# Patient Record
Sex: Female | Born: 1945 | Race: White | Marital: Married | Smoking: Never smoker
Health system: Southern US, Community
[De-identification: ages and names within clinical notes are randomized; demographics above are authoritative.]

## PROBLEM LIST (undated history)

## (undated) DIAGNOSIS — E039 Hypothyroidism, unspecified: Secondary | ICD-10-CM

## (undated) DIAGNOSIS — K219 Gastro-esophageal reflux disease without esophagitis: Secondary | ICD-10-CM

## (undated) HISTORY — PX: TUBAL LIGATION: SHX77

---

## 2017-04-08 ENCOUNTER — Encounter: Payer: Self-pay | Admitting: Emergency Medicine

## 2017-04-08 ENCOUNTER — Emergency Department: Payer: Medicare Other

## 2017-04-08 ENCOUNTER — Emergency Department
Admission: EM | Admit: 2017-04-08 | Discharge: 2017-04-08 | Disposition: A | Discharge status: Home / Self Care | Payer: Medicare Other | Attending: Emergency Medicine | Admitting: Emergency Medicine

## 2017-04-08 DIAGNOSIS — R0789 Other chest pain: Secondary | ICD-10-CM | POA: Insufficient documentation

## 2017-04-08 DIAGNOSIS — R079 Chest pain, unspecified: Secondary | ICD-10-CM

## 2017-04-08 LAB — CBC
HCT: 43.9 % (ref 35.0–47.0)
Hemoglobin: 14.6 g/dL (ref 12.0–16.0)
MCH: 29.9 pg (ref 26.0–34.0)
MCHC: 33.2 g/dL (ref 32.0–36.0)
MCV: 90.1 fL (ref 80.0–100.0)
PLATELETS: 250 10*3/uL (ref 150–440)
RBC: 4.88 MIL/uL (ref 3.80–5.20)
RDW: 14.2 % (ref 11.5–14.5)
WBC: 4.8 10*3/uL (ref 3.6–11.0)

## 2017-04-08 LAB — BASIC METABOLIC PANEL
Anion gap: 12 (ref 5–15)
BUN: 9 mg/dL (ref 6–20)
CALCIUM: 9.3 mg/dL (ref 8.9–10.3)
CO2: 22 mmol/L (ref 22–32)
Chloride: 105 mmol/L (ref 101–111)
Creatinine, Ser: 0.63 mg/dL (ref 0.44–1.00)
GFR calc non Af Amer: >60 mL/min (ref >60)
Glucose, Bld: 91 mg/dL (ref 65–99)
Potassium: 3.6 mmol/L (ref 3.5–5.1)
SODIUM: 139 mmol/L (ref 135–145)

## 2017-04-08 LAB — TROPONIN I

## 2017-04-08 MED ORDER — PANTOPRAZOLE SODIUM 40 MG PO TBEC: 40.0000 mg | DELAYED_RELEASE_TABLET | Freq: Every day | ORAL | 1 refills | Status: DC

## 2017-04-08 MED ORDER — IOPAMIDOL (ISOVUE-370) INJECTION 76%
75.0000 mL | Freq: Once | INTRAVENOUS | Status: AC | PRN
  Administered 2017-04-08: 75 mL via INTRAVENOUS

## 2017-04-08 MED ORDER — GI COCKTAIL ~~LOC~~
30.0000 mL | Freq: Once | ORAL | Status: AC
  Administered 2017-04-08: 30 mL via ORAL
  Filled 2017-04-08: qty 30

## 2017-04-08 NOTE — ED Provider Notes (Signed)
[facility] Emergency Department Provider Note  Time seen: 8:55 AM  I have reviewed the triage vital signs and the nursing notes.   HISTORY  Chief Complaint Chest Pain and Palpitations    HPI Marilyn Castillo is a 72 y.o. female with no past medical history, presents the emergency department for intermittent chest discomfort. According to the patient for the past 3 weeks she has had various pains in her chest including her central chest, back, sometimes in her neck. Chest is states occasionally she will have numbness or tingling sensations in her legs and arms. Patient denies any trouble breathing, nausea or diaphoresis. States the pain has been intermittent over the past 3 weeks and occurs in different parts of her chest. During our examination the patient states the pain was jumping around from her left chest to her back. Denies any heartburn history. Patient states she has not seen a doctor since the 1990s. Denies any leg pain or swelling. Denies any pleuritic pain. Describes the discomfort as mild aching type pain that jumps around from place to place within her chest.  History reviewed. No pertinent past medical history.  There are no active problems to display for this patient.   Past Surgical History:  Procedure Laterality Date  . TUBAL LIGATION      Prior to Admission medications   Not on File    Allergies  Allergen Reactions  . Penicillins Anaphylaxis    History reviewed. No pertinent family history.  Social History Social History  Substance Use Topics  . Smoking status: Never Smoker  . Smokeless tobacco: Never Used  . Alcohol use Yes    Review of Systems Constitutional: Negative for fever. Eyes: Negative for visual changes. ENT: Negative for congestion Cardiovascular: Positive for chest pain, worse since last night although somewhat improved this morning. Respiratory: Negative for shortness of breath. Negative for  cough Gastrointestinal: Negative for abdominal pain, vomiting and diarrhea. Genitourinary: Negative for dysuria. Musculoskeletal: Negative for back pain. Skin: Negative for rash. Neurological: Negative for headache All other ROS negative  ____________________________________________   PHYSICAL EXAM:  VITAL SIGNS: ED Triage Vitals  Enc Vitals Group     BP 04/08/17 0731 (!) 155/76     Pulse Rate 04/08/17 0731 80     Resp 04/08/17 0731 20     Temp 04/08/17 0731 97.8 F (36.6 C)     Temp Source 04/08/17 0731 Oral     SpO2 04/08/17 0731 95 %     Weight 04/08/17 0728 200 lb (90.7 kg)     Height 04/08/17 0728 5\' 6"  (1.676 m)     Head Circumference --      Peak Flow --      Pain Score 04/08/17 0727 8     Pain Loc --      Pain Edu? --      Excl. in [place]? --     Constitutional: Alert and oriented. Well appearing and in no distress. Eyes: Normal exam ENT   Head: Normocephalic and atraumatic.   Mouth/Throat: Mucous membranes are moist. Cardiovascular: Normal rate, regular rhythm. No murmur Respiratory: Normal respiratory effort without tachypnea nor retractions. Breath sounds are clear Gastrointestinal: Soft and nontender. No distention.  Musculoskeletal: Nontender with normal range of motion in all extremities. No lower extremity tenderness or edema. Neurologic:  Normal speech and language. No gross focal neurologic deficits Skin:  Skin is warm, dry and intact.  Psychiatric: Mood and affect are normal.  ____________________________________________    EKG  EKG reviewed and interpreted by myself shows normal sinus rhythm at 84 bpm, narrow QRS, normal axis, normal intervals, no concerning ST changes.  ____________________________________________    RADIOLOGY  Chest x-ray is negative  ____________________________________________   INITIAL IMPRESSION / ASSESSMENT AND PLAN / ED COURSE  Pertinent labs & imaging results that were available during my care of the  patient were reviewed by me and considered in my medical decision making (see chart for details).  Patient presents to the emergency department for intermittent chest discomfort over the past 3 weeks although somewhat worse since last night. Patient states the pain jumps around in her chest. States mild dull aching type pain but cannot pinpoint where it is that she states it moves around her chest. Denies any cough or pleuritic pain. No leg pain or tenderness. No lower extremity edema. Overall the patient appears well with a reassuring EKG, negative labs including negative troponin, normal chest x-ray. We will treat with a GI cocktail, given the patient's vague chest discomfort worse since last night we'll obtain a CT scan.  CT scan is negative. Patient continues to appear well. I discussed with the patient follow up with cardiologist for a stress test as well as follow-up with a GI doctor. I also discussed my typical return precautions. Patient agreeable.  ____________________________________________   FINAL CLINICAL IMPRESSION(S) / ED DIAGNOSES  Chest pain    Harvest Dark, MD 04/08/17 1115

## 2017-04-08 NOTE — Discharge Instructions (Signed)
You have been seen in the emergency department today for chest pain. Your workup has shown normal results. As we discussed please follow-up with your primary care physician in the next 1-2 days for recheck. Return to the emergency department for any further chest pain, trouble breathing, or any other symptom personally concerning to yourself.  Please take your medication as prescribed daily. Please call the number provided to arrange follow-up with a primary care doctor, cardiologist, as well as a GI doctor.

## 2017-04-08 NOTE — ED Triage Notes (Signed)
Pt states she started having chest pain about 3 weeks ago, states last night, the pain was intense and had some shortness of breath. States 3 weeks ago she had an episode of blurred vision and dizziness and since then she states she has had chest pain off and on.

## 2017-05-05 ENCOUNTER — Emergency Department
Admission: EM | Admit: 2017-05-05 | Discharge: 2017-05-05 | Disposition: A | Discharge status: Home / Self Care | Payer: Medicare Other | Attending: Emergency Medicine | Admitting: Emergency Medicine

## 2017-05-05 ENCOUNTER — Encounter: Payer: Self-pay | Admitting: Emergency Medicine

## 2017-05-05 ENCOUNTER — Emergency Department: Payer: Medicare Other

## 2017-05-05 DIAGNOSIS — R51 Headache: Secondary | ICD-10-CM | POA: Insufficient documentation

## 2017-05-05 DIAGNOSIS — R519 Headache, unspecified: Secondary | ICD-10-CM

## 2017-05-05 HISTORY — DX: Gastro-esophageal reflux disease without esophagitis: K21.9

## 2017-05-05 LAB — CBC WITH DIFFERENTIAL/PLATELET
BASOS ABS: 0 10*3/uL (ref 0–0.1)
Basophils Relative: 1 %
EOS PCT: 2 %
Eosinophils Absolute: 0.1 10*3/uL (ref 0–0.7)
HCT: 42 % (ref 35.0–47.0)
HEMOGLOBIN: 14.3 g/dL (ref 12.0–16.0)
LYMPHS PCT: 19 %
Lymphs Abs: 1 10*3/uL (ref 1.0–3.6)
MCH: 31 pg (ref 26.0–34.0)
MCHC: 34 g/dL (ref 32.0–36.0)
MCV: 91 fL (ref 80.0–100.0)
Monocytes Absolute: 0.7 10*3/uL (ref 0.2–0.9)
Monocytes Relative: 13 %
NEUTROS ABS: 3.5 10*3/uL (ref 1.4–6.5)
NEUTROS PCT: 65 %
PLATELETS: 227 10*3/uL (ref 150–440)
RBC: 4.62 MIL/uL (ref 3.80–5.20)
RDW: 14.4 % (ref 11.5–14.5)
WBC: 5.4 10*3/uL (ref 3.6–11.0)

## 2017-05-05 LAB — BASIC METABOLIC PANEL
ANION GAP: 9 (ref 5–15)
BUN: 9 mg/dL (ref 6–20)
CO2: 26 mmol/L (ref 22–32)
Calcium: 9.6 mg/dL (ref 8.9–10.3)
Chloride: 105 mmol/L (ref 101–111)
Creatinine, Ser: 0.71 mg/dL (ref 0.44–1.00)
GFR calc Af Amer: >60 mL/min (ref >60)
GLUCOSE: 101 mg/dL — AB (ref 65–99)
POTASSIUM: 3.6 mmol/L (ref 3.5–5.1)
Sodium: 140 mmol/L (ref 135–145)

## 2017-05-05 MED ORDER — DEXTROSE-NACL 5-0.45 % IV SOLN
INTRAVENOUS | Status: DC
  Administered 2017-05-05: 06:00:00 via INTRAVENOUS

## 2017-05-05 MED ORDER — HYDROCODONE-ACETAMINOPHEN 5-325 MG PO TABS
0.5000 | ORAL_TABLET | Freq: Once | ORAL | Status: AC
  Administered 2017-05-05: 0.5 via ORAL
  Filled 2017-05-05: qty 1

## 2017-05-05 MED ORDER — LORAZEPAM 2 MG/ML IJ SOLN
0.5000 mg | Freq: Once | INTRAMUSCULAR | Status: AC
  Administered 2017-05-05: 0.5 mg via INTRAVENOUS
  Filled 2017-05-05: qty 1

## 2017-05-05 MED ORDER — ONDANSETRON HCL 4 MG/2ML IJ SOLN
4.0000 mg | Freq: Once | INTRAMUSCULAR | Status: AC
  Administered 2017-05-05: 4 mg via INTRAVENOUS
  Filled 2017-05-05: qty 2

## 2017-05-05 MED ORDER — KETOROLAC TROMETHAMINE 30 MG/ML IJ SOLN
10.0000 mg | Freq: Once | INTRAMUSCULAR | Status: AC
Start: 2017-05-05 — End: 2017-05-05
  Administered 2017-05-05: 9.9 mg via INTRAVENOUS
  Filled 2017-05-05: qty 1

## 2017-05-05 MED ORDER — GADOBENATE DIMEGLUMINE 529 MG/ML IV SOLN
17.0000 mL | Freq: Once | INTRAVENOUS | Status: AC | PRN
  Administered 2017-05-05: 17 mL via INTRAVENOUS

## 2017-05-05 NOTE — ED Notes (Signed)
Pt to CT at this time.

## 2017-05-05 NOTE — ED Provider Notes (Signed)
-----------------------------------------   10:34 AM on 05/05/2017 -----------------------------------------  Administration was signed out to me this morning at 7:30, she has very reproducible tenderness to the back of her head but no evidence of infection or inflammation. She is very anxious about this. A CT was negative, MRI was ordered by prior physician. According to sign out, and that is negative patient is safe for discharge. Patient feels much better. She still has a little tenderness to the back of her head but there is no evidence of any evidence of trauma or inflammation or infection, patient would prefer to go home at this time. Return precautions and follow-up given and understood. Exact etiology for this patient's very reproducible scalp tenderness not elucidated either by exam or by extensive imaging. She is neurologically intact at discharge and very eager to leave. She will follow up and return precautions are given and understood   Schuyler Amor, MD 05/05/17 1035

## 2017-05-05 NOTE — ED Notes (Signed)
Pt states pain began yesterday, from base of skull towards face, radiating down entire face. Pt denies OTC tx PTA. Pt states tingling in hands and feet and dry mouth. Pt husband reports pt has had lack of appetite x approx 1 month since seen here for palpitations

## 2017-05-05 NOTE — ED Provider Notes (Signed)
[facility] Emergency Department Provider Note   ____________________________________________   First MD Initiated Contact with Patient 05/05/17 [phone]     (approximate)  I have reviewed the triage vital signs and the nursing notes.   HISTORY  Chief Complaint Headache    HPI Marilyn Castillo is a 71 y.o. female who presents to the ED from home with a chief complaint of headache. Patient describes gradual onset headache starting in her posterior head radiating into her forehead, eyes and face. Symptoms started last evening. Associated with blurry vision and nausea. No history of migraines but had a headache last week as well. Denies recent fever, chills, neck pain, chest pain, shortness of breath, abdominal pain, vomiting, diarrhea. Denies recent travel or trauma. Do not take any OTCs prior to arrival.Spouse denies slurred speech, confusion. Patient denies focal weakness, numbness or tingling.   Past Medical History:  Diagnosis Date  . Acid reflux     There are no active problems to display for this patient.   Past Surgical History:  Procedure Laterality Date  . TUBAL LIGATION      Prior to Admission medications   Medication Sig Start Date End Date Taking? Authorizing Provider  levothyroxine (SYNTHROID, LEVOTHROID) 50 MCG tablet Take 50 mcg by mouth daily. 05/04/17 05/04/18 Yes [provider]  pantoprazole (PROTONIX) 40 MG tablet Take 1 tablet (40 mg total) by mouth daily. 04/08/17 04/08/18  Harvest Dark, MD    Allergies Penicillins  Family history None for migraines None for cerebral aneurysm  Social History Social History  Substance Use Topics  . Smoking status: Never Smoker  . Smokeless tobacco: Never Used  . Alcohol use Yes    Review of Systems  Constitutional: Positive for decreased appetite over the past month without significant weight loss. No fever/chills. Eyes: No visual changes. ENT: No sore  throat. Cardiovascular: Denies chest pain. Respiratory: Denies shortness of breath. Gastrointestinal: No abdominal pain.  No nausea, no vomiting.  No diarrhea.  No constipation. Genitourinary: Negative for dysuria. Musculoskeletal: Negative for back pain. Skin: Negative for rash. Neurological: Positive for headache. Negative for focal weakness or numbness.   ____________________________________________   PHYSICAL EXAM:  VITAL SIGNS: ED Triage Vitals [05/05/17 0448]  Enc Vitals Group     BP      Pulse      Resp      Temp      Temp src      SpO2      Weight 186 lb (84.4 kg)     Height _0  (1.651 m)     Head Circumference      Peak Flow      Pain Score 8     Pain Loc      Pain Edu?      Excl. in [place]?     Constitutional: Alert and oriented. Well appearing and in no acute distress. Eyes: Conjunctivae are normal. PERRL. EOMI. Funduscopy within normal limits. Head: Atraumatic. Nose: No congestion/rhinnorhea. Mouth/Throat: Mucous membranes are moist.  Oropharynx non-erythematous. Neck: No stridor.  No carotid bruits. Supple neck without meningismus. Cardiovascular: Normal rate, regular rhythm. Grossly normal heart sounds.  Good peripheral circulation. Respiratory: Normal respiratory effort.  No retractions. Lungs CTAB. Gastrointestinal: Soft and nontender. No distention. No abdominal bruits. No CVA tenderness. Musculoskeletal: No lower extremity tenderness nor edema.  No joint effusions. Neurologic:  Normal speech and language. Alert and oriented 3. CN II-XII personally intact. No gross focal neurologic deficits are appreciated. No gait instability.  Skin:  Skin is warm, dry and intact. No rash noted. No petechiae. Psychiatric: Mood and affect are flat. Speech and behavior are normal.  ____________________________________________   LABS (all labs ordered are listed, but only abnormal results are displayed)  Labs Reviewed  BASIC METABOLIC PANEL - Abnormal; Notable for  the following:       Result Value   Glucose, Bld 101 (*)    All other components within normal limits  CBC WITH DIFFERENTIAL/PLATELET   ____________________________________________  EKG  None ____________________________________________  RADIOLOGY  CT head interpreted per Dr. Radene Knee: 1. No acute intracranial pathology seen on CT.  2. Mild cortical volume loss and scattered small vessel ischemic  microangiopathy.   ____________________________________________   PROCEDURES  Procedure(s) performed: None  Procedures  Critical Care performed: No  ____________________________________________   INITIAL IMPRESSION / ASSESSMENT AND PLAN / ED COURSE  Pertinent labs & imaging results that were available during my care of the patient were reviewed by me and considered in my medical decision making (see chart for details).  71 year old female, generally healthy, who presents with gradual onset headache associated with blurry vision and nausea. She is neurologically intact without focal deficits. Given her age and new onset of headache, will obtain CT scan. Will obtain screening lab work, administer analgesic, antiemetic and reassess.  Clinical Course as of May 06 755  Fri May 05, 2017  0725 Updated patient and spouse of laboratory and CT imaging results. States she is not feeling any better after Toradol. After lengthy discussion, it seems that patient is complaining of topical pain of her posterior scalp. She describes her pain is worsened if she puts her head on the pillow. Scalp was examined which did not reveal vesicles or signs of active infection. Posterior scalp was tender on my palpation. Patient refused topical analgesics and initially refused IV or oral analgesia because she does not like to take medicines. However, patient still complains of pain and is clearly dissatisfied and frustrated. States she has had a myriad of symptoms for the past month including decreased  appetite, palpitations, heartburn and now headache. She went to her husband's PCP because she does not have one of her own. PCP did more lab work and told her her thyroid level was slightly low. Patient is frustrated and feels like her symptoms have not been addressed by her PCP and feels she needs a diagnosis today. Given her continued head pain and concern for something more sinister causing her head pain, will proceed with MRI brain this morning. Care transferred to Dr. Burlene Arnt pending MRI results/disposition.  [JS]    Clinical Course User Index [JS] Paulette Blanch, MD     ____________________________________________   FINAL CLINICAL IMPRESSION(S) / ED DIAGNOSES  Final diagnoses:  Acute nonintractable headache, unspecified headache type      NEW MEDICATIONS STARTED DURING THIS VISIT:  New Prescriptions   No medications on file     Note:  This document was prepared using Dragon voice recognition software and may include unintentional dictation errors.    Paulette Blanch, MD 05/05/17 [phone]

## 2017-05-05 NOTE — Discharge Instructions (Signed)
Return to the emergency room for any new or worrisome symptoms including but not limited to worsening or changing headache, numbness, weakness, fever, redness or swelling. Follow closely with primary care and, if PCP feels  you need it, follow closely with neurologist. Take Tylenol for the pain if it persists.

## 2017-05-05 NOTE — ED Triage Notes (Signed)
Pt c/o headache since last night accompanied by blurry vision. Pt denies any other accompanying symptoms.

## 2017-06-21 ENCOUNTER — Emergency Department
Admission: EM | Admit: 2017-06-21 | Discharge: 2017-06-21 | Disposition: A | Discharge status: Home / Self Care | Payer: Medicare Other | Attending: Emergency Medicine | Admitting: Emergency Medicine

## 2017-06-21 ENCOUNTER — Emergency Department: Payer: Medicare Other

## 2017-06-21 DIAGNOSIS — Z79899 Other long term (current) drug therapy: Secondary | ICD-10-CM | POA: Diagnosis not present

## 2017-06-21 DIAGNOSIS — R0602 Shortness of breath: Secondary | ICD-10-CM | POA: Diagnosis present

## 2017-06-21 LAB — TROPONIN I: Troponin I: <0.03 ng/mL (ref <0.03)

## 2017-06-21 LAB — BASIC METABOLIC PANEL
Anion gap: 12 (ref 5–15)
BUN: 12 mg/dL (ref 6–20)
CHLORIDE: 107 mmol/L (ref 101–111)
CO2: 22 mmol/L (ref 22–32)
Calcium: 9.8 mg/dL (ref 8.9–10.3)
Creatinine, Ser: 0.73 mg/dL (ref 0.44–1.00)
GFR calc Af Amer: >60 mL/min (ref >60)
GFR calc non Af Amer: >60 mL/min (ref >60)
Glucose, Bld: 101 mg/dL — ABNORMAL HIGH (ref 65–99)
POTASSIUM: 3.9 mmol/L (ref 3.5–5.1)
SODIUM: 141 mmol/L (ref 135–145)

## 2017-06-21 LAB — CBC
HEMATOCRIT: 46.8 % (ref 35.0–47.0)
HEMOGLOBIN: 15.8 g/dL (ref 12.0–16.0)
MCH: 30.3 pg (ref 26.0–34.0)
MCHC: 33.7 g/dL (ref 32.0–36.0)
MCV: 90 fL (ref 80.0–100.0)
Platelets: 267 10*3/uL (ref 150–440)
RBC: 5.2 MIL/uL (ref 3.80–5.20)
RDW: 13.6 % (ref 11.5–14.5)
WBC: 7.6 10*3/uL (ref 3.6–11.0)

## 2017-06-21 NOTE — ED Notes (Signed)
ED Provider at bedside. 

## 2017-06-21 NOTE — Discharge Instructions (Signed)
Fortunately today you're bloodwork EKG and chest x-ray were all reassuring. Her vital signs look good and your exam is very normal. I'm not sure why you felt short of breath, however I'm reassured by everything we did today. Please make an appointment to follow-up with your primary care physician on Monday for reexamination. Return to the emergency department sooner for any concerns.  It was a pleasure to take care of you today, and thank you for coming to our emergency department.  If you have any questions or concerns before leaving please ask the nurse to grab me and I'm more than happy to go through your aftercare instructions again.  If you were prescribed any opioid pain medication today such as Norco, Vicodin, Percocet, morphine, hydrocodone, or oxycodone please make sure you do not drive when you are taking this medication as it can alter your ability to drive safely.  If you have any concerns once you are home that you are not improving or are in fact getting worse before you can make it to your follow-up appointment, please do not hesitate to call 911 and come back for further evaluation.  Darel Hong MD  Results for orders placed or performed during the hospital encounter of 63/01/60  Basic metabolic panel  Result Value Ref Range   Sodium 141 135 - 145 mmol/L   Potassium 3.9 3.5 - 5.1 mmol/L   Chloride 107 101 - 111 mmol/L   CO2 22 22 - 32 mmol/L   Glucose, Bld 101 (H) 65 - 99 mg/dL   BUN 12 6 - 20 mg/dL   Creatinine, Ser 0.73 0.44 - 1.00 mg/dL   Calcium 9.8 8.9 - 10.3 mg/dL   GFR calc non Af Amer >60 >60 mL/min   GFR calc Af Amer >60 >60 mL/min   Anion gap 12 5 - 15  CBC  Result Value Ref Range   WBC 7.6 3.6 - 11.0 K/uL   RBC 5.20 3.80 - 5.20 MIL/uL   Hemoglobin 15.8 12.0 - 16.0 g/dL   HCT 46.8 35.0 - 47.0 %   MCV 90.0 80.0 - 100.0 fL   MCH 30.3 26.0 - 34.0 pg   MCHC 33.7 32.0 - 36.0 g/dL   RDW 13.6 11.5 - 14.5 %   Platelets 267 150 - 440 K/uL  Troponin I  Result  Value Ref Range   Troponin I <0.03 <0.03 ng/mL   Dg Chest 2 View  Result Date: 06/21/2017 CLINICAL DATA:  Chest pain and dyspnea. Patient on fluconazole for thrush and developed symptoms 1 hour after taking medication. EXAM: CHEST  2 VIEW COMPARISON:  04/08/2017 CXR FINDINGS: The heart size and mediastinal contours are within normal limits. Atherosclerosis of the thoracic aortic arch. Both lungs are clear. No acute nor suspicious osseous abnormalities. Mild degenerative change of the upper thoracic spine with slight levoconvex curvature of the lower thoracic spine. IMPRESSION: No active cardiopulmonary disease.  Aortic atherosclerosis. Electronically Signed   By: Ashley Royalty M.D.   On: 06/21/2017 18:40

## 2017-06-21 NOTE — ED Triage Notes (Signed)
Pt reports starting fluconazole today for the first time for thrush.  Approximately 1 hour after taking medication, pt reports having shortness of breath and chest pressure.  Pt states that she is unsure if this is related to medication reaction or something else.  Pt breathing even and nonlabored at this time, pt in NAD.  Pt denies feeling like her throat is closing up and no rash noted at this time.  Pt states that she feels more short of breath than having chest pain.  Pt is accompanied by family member to triage.

## 2017-06-21 NOTE — ED Notes (Signed)
First nurse note  Presents with some diff breathing and chest tightness

## 2017-06-21 NOTE — ED Provider Notes (Signed)
[facility] Emergency Department Provider Note  ____________________________________________   First MD Initiated Contact with Patient 06/21/17 Bosie Helper     (approximate)  I have reviewed the triage vital signs and the nursing notes.   HISTORY  Chief Complaint Shortness of Breath and Chest Pain    HPI Marilyn Castillo is a 71 y.o. female who comes to the emergency department with atypical chest pain and shortness of breath that began shortly after taking her first lifetime dose of fluconazole. She was prescribed fluconazole for nystatin resistant oral thrush. Earlier today she took her first dose about an hour later she began to feel upper chest pressure-like sensation and mild shortness of breath. She had no itching. She did not feel like her throat was closing. She's had no leg swelling. No history of DVT or pulmonary embolism.   Past Medical History:  Diagnosis Date  . Acid reflux     There are no active problems to display for this patient.   Past Surgical History:  Procedure Laterality Date  . TUBAL LIGATION      Prior to Admission medications   Medication Sig Start Date End Date Taking? Authorizing Provider  levothyroxine (SYNTHROID, LEVOTHROID) 50 MCG tablet Take 50 mcg by mouth daily. 05/04/17 05/04/18  [provider]  pantoprazole (PROTONIX) 40 MG tablet Take 1 tablet (40 mg total) by mouth daily. 04/08/17 04/08/18  Harvest Dark, MD    Allergies Penicillins  No family history on file.  Social History Social History  Substance Use Topics  . Smoking status: Never Smoker  . Smokeless tobacco: Never Used  . Alcohol use No    Review of Systems Constitutional: No fever/chills Eyes: No visual changes. ENT: No sore throat. Cardiovascular: Positive chest pain. Respiratory: Positive shortness of breath. Gastrointestinal: No abdominal pain.  No nausea, no vomiting.  No diarrhea.  No constipation. Genitourinary: Negative for  dysuria. Musculoskeletal: Negative for back pain. Skin: Negative for rash. Neurological: Negative for headaches, focal weakness or numbness.   ____________________________________________   PHYSICAL EXAM:  VITAL SIGNS: ED Triage Vitals [06/21/17 1651]  Enc Vitals Group     BP 140/77     Pulse Rate 88     Resp 18     Temp 98.2 F (36.8 C)     Temp Source Oral     SpO2 97 %     Weight 164 lb 9 oz (74.6 kg)     Height 5\' 5"  (1.651 m)     Head Circumference      Peak Flow      Pain Score      Pain Loc      Pain Edu?      Excl. in [place]?     Constitutional: Alert and oriented 4 well appearing nontoxic no diaphoresis speaks full clear sentences Eyes: PERRL EOMI. Head: Atraumatic. Nose: No congestion/rhinnorhea. Mouth/Throat: No trismus Neck: No stridor.   Cardiovascular: Normal rate, regular rhythm. Grossly normal heart sounds.  Good peripheral circulation. Respiratory: Normal respiratory effort.  No retractions. Lungs CTAB and moving good air Gastrointestinal: Soft nontender Musculoskeletal: No lower extremity edema   Neurologic:  Normal speech and language. No gross focal neurologic deficits are appreciated. Skin:  Skin is warm, dry and intact. No rash noted. Psychiatric: Mood and affect are normal. Speech and behavior are normal.    ____________________________________________   DIFFERENTIAL includes but not limited to  Allergic reaction, anaphylaxis, pulmonary embolism, pneumonia, no sign ____________________________________________   LABS (all labs ordered are  listed, but only abnormal results are displayed)  Labs Reviewed  BASIC METABOLIC PANEL - Abnormal; Notable for the following:       Result Value   Glucose, Bld 101 (*)    All other components within normal limits  CBC  TROPONIN I    No signs of acute ischemia __________________________________________  EKG  ED ECG REPORT I, Darel Hong, the attending physician, personally viewed and  interpreted this ECG.  Date: 06/21/2017 Rate: 88 Rhythm: normal sinus rhythm QRS Axis: normal Intervals: normal ST/T Wave abnormalities: normal Narrative Interpretation: unremarkable  ____________________________________________  RADIOLOGY  Chest x-ray with no acute disease ____________________________________________   PROCEDURES  Procedure(s) performed: no  Procedures  Critical Care performed: no  Observation: no ____________________________________________   INITIAL IMPRESSION / ASSESSMENT AND PLAN / ED COURSE  Pertinent labs & imaging results that were available during my care of the patient were reviewed by me and considered in my medical decision making (see chart for details).  The patient reports primarily shortness of breath more than chest pain about an hour after taking a new medication. Her history is not exactly typical for an allergic reaction as she had no rash and itching. Unclear etiology of her symptoms however her labs EKG and chest x-ray are reassuring. I recommended against continuing her fluconazole and I will refer her back to primary care.      ____________________________________________   FINAL CLINICAL IMPRESSION(S) / ED DIAGNOSES  Final diagnoses:  Shortness of breath      NEW MEDICATIONS STARTED DURING THIS VISIT:  Discharge Medication List as of 06/21/2017  7:09 PM       Note:  This document was prepared using Dragon voice recognition software and may include unintentional dictation errors.     Darel Hong, MD 06/22/17 [phone]

## 2017-09-27 ENCOUNTER — Encounter
Admission: RE | Disposition: A | Discharge status: Home / Self Care | Payer: Self-pay | Source: Ambulatory Visit | Attending: Internal Medicine

## 2017-09-27 ENCOUNTER — Ambulatory Visit: Payer: Medicare Other | Admitting: Anesthesiology

## 2017-09-27 ENCOUNTER — Ambulatory Visit
Admission: RE | Admit: 2017-09-27 | Discharge: 2017-09-27 | Disposition: A | Discharge status: Home / Self Care | Payer: Medicare Other | Source: Ambulatory Visit | Attending: Internal Medicine | Admitting: Internal Medicine

## 2017-09-27 ENCOUNTER — Encounter: Payer: Self-pay | Admitting: *Deleted

## 2017-09-27 DIAGNOSIS — R6881 Early satiety: Secondary | ICD-10-CM | POA: Diagnosis not present

## 2017-09-27 DIAGNOSIS — Z880 Allergy status to penicillin: Secondary | ICD-10-CM | POA: Insufficient documentation

## 2017-09-27 DIAGNOSIS — D123 Benign neoplasm of transverse colon: Secondary | ICD-10-CM | POA: Diagnosis not present

## 2017-09-27 DIAGNOSIS — K3189 Other diseases of stomach and duodenum: Secondary | ICD-10-CM | POA: Diagnosis not present

## 2017-09-27 DIAGNOSIS — K210 Gastro-esophageal reflux disease with esophagitis: Secondary | ICD-10-CM | POA: Insufficient documentation

## 2017-09-27 DIAGNOSIS — K640 First degree hemorrhoids: Secondary | ICD-10-CM | POA: Diagnosis not present

## 2017-09-27 DIAGNOSIS — E039 Hypothyroidism, unspecified: Secondary | ICD-10-CM | POA: Insufficient documentation

## 2017-09-27 DIAGNOSIS — K228 Other specified diseases of esophagus: Secondary | ICD-10-CM | POA: Diagnosis not present

## 2017-09-27 DIAGNOSIS — Z79899 Other long term (current) drug therapy: Secondary | ICD-10-CM | POA: Insufficient documentation

## 2017-09-27 DIAGNOSIS — Z1211 Encounter for screening for malignant neoplasm of colon: Secondary | ICD-10-CM | POA: Diagnosis present

## 2017-09-27 DIAGNOSIS — K2950 Unspecified chronic gastritis without bleeding: Secondary | ICD-10-CM | POA: Diagnosis not present

## 2017-09-27 DIAGNOSIS — K5730 Diverticulosis of large intestine without perforation or abscess without bleeding: Secondary | ICD-10-CM | POA: Insufficient documentation

## 2017-09-27 HISTORY — PX: ESOPHAGOGASTRODUODENOSCOPY (EGD) WITH PROPOFOL: SHX5813

## 2017-09-27 HISTORY — DX: Hypothyroidism, unspecified: E03.9

## 2017-09-27 HISTORY — PX: COLONOSCOPY WITH PROPOFOL: SHX5780

## 2017-09-27 SURGERY — ESOPHAGOGASTRODUODENOSCOPY (EGD) WITH PROPOFOL
Anesthesia: General

## 2017-09-27 MED ORDER — FENTANYL CITRATE (PF) 100 MCG/2ML IJ SOLN
INTRAMUSCULAR | Status: DC | PRN
  Administered 2017-09-27 (×2): 50 ug via INTRAVENOUS

## 2017-09-27 MED ORDER — PHENYLEPHRINE HCL 10 MG/ML IJ SOLN
INTRAMUSCULAR | Status: DC | PRN
  Administered 2017-09-27 (×2): 100 ug via INTRAVENOUS

## 2017-09-27 MED ORDER — PROPOFOL 500 MG/50ML IV EMUL
INTRAVENOUS | Status: AC
  Filled 2017-09-27: qty 50

## 2017-09-27 MED ORDER — PROPOFOL 500 MG/50ML IV EMUL
INTRAVENOUS | Status: DC | PRN
  Administered 2017-09-27: 140 ug/kg/min via INTRAVENOUS

## 2017-09-27 MED ORDER — LIDOCAINE 2% (20 MG/ML) 5 ML SYRINGE
INTRAMUSCULAR | Status: DC | PRN
  Administered 2017-09-27: 40 mg via INTRAVENOUS

## 2017-09-27 MED ORDER — FENTANYL CITRATE (PF) 100 MCG/2ML IJ SOLN
INTRAMUSCULAR | Status: AC
  Filled 2017-09-27: qty 2

## 2017-09-27 MED ORDER — EPHEDRINE SULFATE 50 MG/ML IJ SOLN
INTRAMUSCULAR | Status: DC | PRN
  Administered 2017-09-27: 10 mg via INTRAVENOUS

## 2017-09-27 MED ORDER — SODIUM CHLORIDE 0.9 % IV SOLN
INTRAVENOUS | Status: DC
  Administered 2017-09-27: 1000 mL via INTRAVENOUS
  Administered 2017-09-27: 14:00:00 via INTRAVENOUS

## 2017-09-27 MED ORDER — PROPOFOL 10 MG/ML IV BOLUS
INTRAVENOUS | Status: DC | PRN
  Administered 2017-09-27: 100 mg via INTRAVENOUS

## 2017-09-27 NOTE — Anesthesia Postprocedure Evaluation (Signed)
Anesthesia Post Note  Patient: Toniann Ket  Procedure(s) Performed: ESOPHAGOGASTRODUODENOSCOPY (EGD) WITH PROPOFOL (N/A ) COLONOSCOPY WITH PROPOFOL (N/A )  Patient location during evaluation: PACU Anesthesia Type: General Level of consciousness: awake Pain management: pain level controlled Vital Signs Assessment: post-procedure vital signs reviewed and stable Respiratory status: spontaneous breathing Cardiovascular status: stable Anesthetic complications: no     Last Vitals:  Vitals:   09/27/17 1436 09/27/17 1445  BP: (!) 114/45 (!) 89/54  Pulse: (!) 58 74  Resp: 13 19  Temp: (!) 36.1 C   SpO2: 100% 94%    Last Pain:  Vitals:   09/27/17 1435  TempSrc: Tympanic  PainSc: Asleep                 VAN STAVEREN,Damaris Abeln

## 2017-09-27 NOTE — Anesthesia Post-op Follow-up Note (Signed)
Anesthesia QCDR form completed.        

## 2017-09-27 NOTE — Anesthesia Preprocedure Evaluation (Signed)
Anesthesia Evaluation  Patient identified by MRN, date of birth, ID band Patient awake    Reviewed: Allergy & Precautions, NPO status , Patient's Chart, lab work & pertinent test results, reviewed documented beta blocker date and time   Airway Mallampati: II  TM Distance: >3 FB     Dental  (+) Chipped   Pulmonary           Cardiovascular      Neuro/Psych    GI/Hepatic   Endo/Other  Hypothyroidism   Renal/GU      Musculoskeletal   Abdominal   Peds  Hematology   Anesthesia Other Findings   Reproductive/Obstetrics                             Anesthesia Physical Anesthesia Plan  ASA: III  Anesthesia Plan: General   Post-op Pain Management:    Induction: Intravenous  PONV Risk Score and Plan:   Airway Management Planned:   Additional Equipment:   Intra-op Plan:   Post-operative Plan:   Informed Consent: I have reviewed the patients History and Physical, chart, labs and discussed the procedure including the risks, benefits and alternatives for the proposed anesthesia with the patient or authorized representative who has indicated his/her understanding and acceptance.     Plan Discussed with: CRNA  Anesthesia Plan Comments:         Anesthesia Quick Evaluation

## 2017-09-27 NOTE — Transfer of Care (Signed)
Immediate Anesthesia Transfer of Care Note  Patient: Marilyn Castillo  Procedure(s) Performed: ESOPHAGOGASTRODUODENOSCOPY (EGD) WITH PROPOFOL (N/A ) COLONOSCOPY WITH PROPOFOL (N/A )  Patient Location: PACU and Endoscopy Unit  Anesthesia Type:General  Level of Consciousness: sedated  Airway & Oxygen Therapy: Patient Spontanous Breathing and Patient connected to nasal cannula oxygen  Post-op Assessment: Report given to RN and Post -op Vital signs reviewed and stable  Post vital signs: Reviewed and stable  Last Vitals:  Vitals:   09/27/17 1304 09/27/17 1435  BP: 119/71 (!) (P) 114/45  Pulse: 92   Resp: 18   Temp: 36.8 C (!) (P) 36.1 C  SpO2: (!) 10%     Last Pain:  Vitals:   09/27/17 1435  TempSrc: (P) Tympanic         Complications: No apparent anesthesia complications

## 2017-09-27 NOTE — Op Note (Signed)
[facility] Gastroenterology Patient Name: Marilyn Castillo Procedure Date: 09/27/2017 1:40 PM MRN: [id number] Account #: [id number] Date of Birth: 10-19-1946 Admit Type: Outpatient Age: 71 Room: [facility] ENDO ROOM 3 Gender: Female Note Status: Finalized Procedure:            Upper GI endoscopy Indications:          Early satiety, Nausea, Weight loss Providers:            Benay Pike. Alice Reichert MD, MD Referring MD:         Youlanda Roys. Lovie Macadamia, MD (Referring MD) Medicines:            Propofol per Anesthesia Complications:        No immediate complications. Procedure:            Pre-Anesthesia Assessment:                       - ASA Grade Assessment: III - A patient with severe                        systemic disease.                       After obtaining informed consent, the endoscope was                        passed under direct vision. Throughout the procedure,                        the patient's blood pressure, pulse, and oxygen                        saturations were monitored continuously. The Endoscope                        was introduced through the mouth, and advanced to the                        third part of duodenum. The upper GI endoscopy was                        accomplished without difficulty. The patient tolerated                        the procedure well. Findings:      The Z-line was irregular and was found 37 cm from the incisors. Biopsies       were taken with a cold forceps for histology.      Localized moderately erythematous mucosa without bleeding was found on       the posterior wall of the gastric antrum. Biopsies were taken with a       cold forceps for histology. Biopsies were taken with a cold forceps for       Helicobacter pylori testing.      A deformity was found on the posterior wall of the gastric antrum.      The examined duodenum was normal. Impression:           - Z-line irregular, 37 cm from the incisors. Biopsied.          - Erythematous mucosa in the posterior wall of the  gastric antrum. Biopsied.                       - Acquired deformity in the gastric body (posterior                        wall).                       - Normal examined duodenum. Recommendation:       - Await pathology results.                       - See the other procedure note for documentation of                        additional recommendations. Procedure Code(s):    --- Professional ---                       [phone], Esophagogastroduodenoscopy, flexible, transoral;                        with biopsy, single or multiple Diagnosis Code(s):    --- Professional ---                       K22.8, Other specified diseases of esophagus                       K31.89, Other diseases of stomach and duodenum                       R68.81, Early satiety                       R11.0, Nausea                       R63.4, Abnormal weight loss CPT copyright 2016 American Medical Association. All rights reserved. The codes documented in this report are preliminary and upon coder review may  be revised to meet current compliance requirements. Efrain Sella MD, MD 09/27/2017 2:33:00 PM This report has been signed electronically. Number of Addenda: 0 Note Initiated On: 09/27/2017 1:40 PM      [facility]

## 2017-09-27 NOTE — Op Note (Signed)
[facility] Gastroenterology Patient Name: Marilyn Castillo Procedure Date: 09/27/2017 1:39 PM MRN: [id number] Account #: [id number] Date of Birth: [birth year] Admit Type: Outpatient Age: 71 Room: [facility] ENDO ROOM 3 Gender: Female Note Status: Finalized Procedure:            Colonoscopy Indications:          Screening for colorectal malignant neoplasm Providers:            Benay Pike. Alice Reichert MD, MD Referring MD:         Youlanda Roys. Lovie Macadamia, MD (Referring MD) Medicines:            Propofol per Anesthesia Complications:        No immediate complications. Procedure:            Pre-Anesthesia Assessment:                       - The risks and benefits of the procedure and the                        sedation options and risks were discussed with the                        patient. All questions were answered and informed                        consent was obtained.                       - Patient identification and proposed procedure were                        verified prior to the procedure by the nurse. The                        procedure was verified in the procedure room.                       - ASA Grade Assessment: III - A patient with severe                        systemic disease.                       - After reviewing the risks and benefits, the patient                        was deemed in satisfactory condition to undergo the                        procedure.                       After obtaining informed consent, the colonoscope was                        passed under direct vision. Throughout the procedure,                        the patient's blood pressure, pulse, and oxygen  saturations were monitored continuously. The                        Colonoscope was introduced through the anus and                        advanced to the the cecum, identified by appendiceal                        orifice and ileocecal valve. The colonoscopy was                         performed without difficulty. The patient tolerated the                        procedure well. The quality of the bowel preparation                        was good. The ileocecal valve, appendiceal orifice, and                        rectum were photographed. Findings:      Many small-mouthed diverticula were found in the entire colon. There was       no evidence of diverticular bleeding.      A 3 mm polyp was found in the transverse colon. The polyp was sessile.       The polyp was removed with a jumbo cold forceps. Resection and retrieval       were complete.      The entire examined colon appeared normal.      Non-bleeding internal hemorrhoids were found during retroflexion. The       hemorrhoids were Grade I (internal hemorrhoids that do not prolapse). Impression:           - Mild diverticulosis in the entire examined colon.                        There was no evidence of diverticular bleeding.                       - One 3 mm polyp in the transverse colon, removed with                        a jumbo cold forceps. Resected and retrieved.                       - The entire examined colon is normal.                       - Non-bleeding internal hemorrhoids. Recommendation:       - Patient has a contact number available for                        emergencies. The signs and symptoms of potential                        delayed complications were discussed with the patient.                        Return to normal activities tomorrow. Written discharge  instructions were provided to the patient.                       - Resume previous diet.                       - Continue present medications.                       - Await pathology results.                       - Repeat colonoscopy for surveillance based on                        pathology results.                       - Return to GI office PRN.                       - The findings and  recommendations were discussed with                        the patient.                       - The findings and recommendations were discussed with                        the patient's family. Procedure Code(s):    --- Professional ---                       [phone], Colonoscopy, flexible; with biopsy, single or                        multiple Diagnosis Code(s):    --- Professional ---                       Z12.11, Encounter for screening for malignant neoplasm                        of colon                       K64.0, First degree hemorrhoids                       D12.3, Benign neoplasm of transverse colon (hepatic                        flexure or splenic flexure)                       K57.30, Diverticulosis of large intestine without                        perforation or abscess without bleeding CPT copyright 2016 American Medical Association. All rights reserved. The codes documented in this report are preliminary and upon coder review may  be revised to meet current compliance requirements. Efrain Sella MD, MD 09/27/2017 2:36:19 PM This report has been signed electronically. Number of Addenda: 0 Note Initiated On: 09/27/2017 1:39 PM Scope Withdrawal Time: 0 hours 7 minutes  48 seconds  Total Procedure Duration: 0 hours 14 minutes 21 seconds       [facility]

## 2017-09-27 NOTE — Interval H&P Note (Signed)
History and Physical Interval Note:  09/27/2017 1:59 PM  Marilyn Castillo  has presented today for surgery, with the diagnosis of SCREENING EARLY SATIETY  The various methods of treatment have been discussed with the patient and family. After consideration of risks, benefits and other options for treatment, the patient has consented to  Procedure(s): ESOPHAGOGASTRODUODENOSCOPY (EGD) WITH PROPOFOL (N/A) COLONOSCOPY WITH PROPOFOL (N/A) as a surgical intervention .  The patient's history has been reviewed, patient examined, no change in status, stable for surgery.  I have reviewed the patient's chart and labs.  Questions were answered to the patient's satisfaction.     [place], [place]

## 2017-09-27 NOTE — H&P (Signed)
Outpatient short stay form Pre-procedure 09/27/2017 1:55 PM Dudley Cooley K. Alice Reichert, M.D.  Primary Physician: Juluis Pitch, M.D  Reason for visit: Early satiety, colon cancer screening  History of present illness:  71 y/o female presents for early satiety, poor appetite and subjective weight loss symptoms which have mostly resolved today. She says her weight has stabilized and appears good. She denies dysphagia. Overall, she denies LGI symptoms of rectal bleeding, abdominal pain.    Current Facility-Administered Medications:  .  0.9 %  sodium chloride infusion, , Intravenous, Continuous, [place], Benay Pike, MD, Last Rate: 20 mL/hr at 09/27/17 1318  Prescriptions Prior to Admission  Medication Sig Dispense Refill Last Dose  . levothyroxine (SYNTHROID, LEVOTHROID) 50 MCG tablet Take 50 mcg by mouth daily.   Past Week at Unknown time  . pantoprazole (PROTONIX) 40 MG tablet Take 1 tablet (40 mg total) by mouth daily. (Patient not taking: Reported on 09/27/2017) 30 tablet 1 Not Taking at Unknown time     Allergies  Allergen Reactions  . Penicillins Anaphylaxis    Has patient had a PCN reaction causing immediate rash, facial/tongue/throat swelling, SOB or lightheadedness with hypotension: Yes Has patient had a PCN reaction causing severe rash involving mucus membranes or skin necrosis: Unknown Has patient had a PCN reaction that required hospitalization: Unknown Has patient had a PCN reaction occurring within the last 10 years: Unknown If all of the above answers are "NO", then may proceed with Cephalosporin use.      Past Medical History:  Diagnosis Date  . Acid reflux   . Hypothyroidism     Review of systems:   Review of Systems  Constitutional: Negative for chills, fever and weight loss.  HENT: Negative for hearing loss and tinnitus.   Eyes: Negative for blurred vision.  Cardiovascular: Positive for chest pain. Negative for palpitations and leg swelling.  Skin: Negative.    Neurological: Negative for dizziness.  Endo/Heme/Allergies: Does not bruise/bleed easily.  Psychiatric/Behavioral: Negative for depression and hallucinations. The patient is not nervous/anxious.      Physical Exam  General appearance: alert, cooperative and appears stated age Resp: clear to auscultation bilaterally Cardio: regular rate and rhythm, S1, S2 normal, no murmur, click, rub or gallop GI: soft, non-tender; bowel sounds normal; no masses,  no organomegaly     Planned procedures: EGD and colonoscopy. The patient understands the nature of the planned procedure, indications, risks, alternatives and potential complications including but not limited to bleeding, infection, perforation, damage to internal organs and possible oversedation/side effects from anesthesia. The patient agrees and gives consent to proceed.  Please refer to procedure notes for findings, recommendations and patient disposition/instructions.    Alfred Harrel K. Alice Reichert, M.D. Gastroenterology 09/27/2017  1:55 PM

## 2017-09-29 ENCOUNTER — Encounter: Payer: Self-pay | Admitting: Internal Medicine

## 2017-09-29 LAB — SURGICAL PATHOLOGY

## 2018-02-20 IMAGING — MR MR HEAD WO/W CM
12 series · 48 of 48 positions shown · IV contrast (multihance)
Comparison: Head CT without contrast 3225 hours today.

CLINICAL DATA: 70-year-old female with head pain radiating from the
skullbase to the face. Blurred vision, tingling.

EXAM:
MRI HEAD WITHOUT AND WITH CONTRAST
TECHNIQUE: Multiplanar, multiecho pulse sequences of the brain and surrounding
structures were obtained without and with intravenous contrast.
CONTRAST:  17mL MULTIHANCE GADOBENATE DIMEGLUMINE 529 MG/ML IV SOLN

[Series 2: T1 · sagittal · 5.0mm · 0.45mm/px · 2 of 27 slices shown (1 of 2)]
[im 1/27]
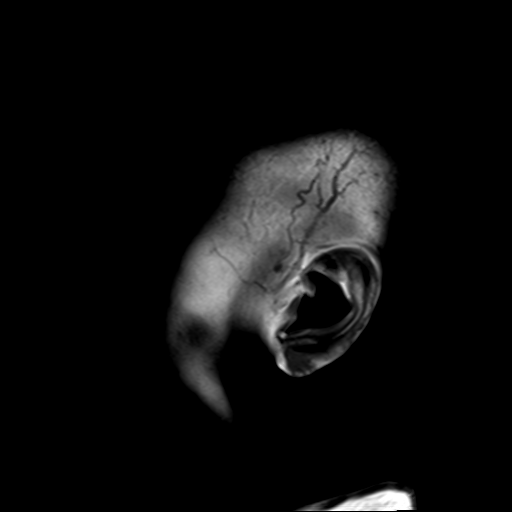
[im 27/27]
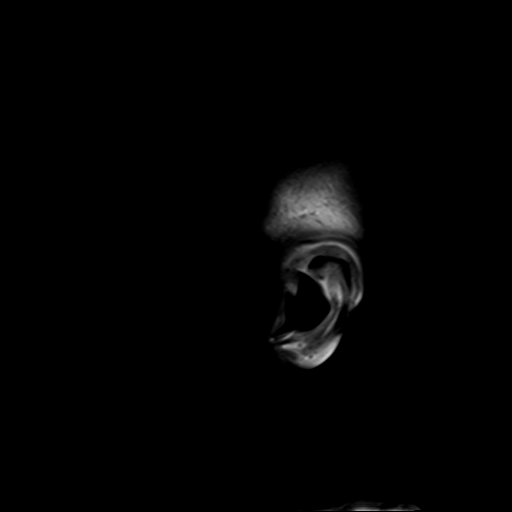

[Series 4: DWI · axial · 3.0mm · 1.80mm/px · z∈[-47,+113]mm · 3 of 52 slices shown (1 of 2)]
[im 1/52]
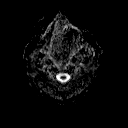
[im 26/52]
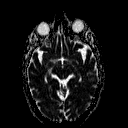
[im 52/52]
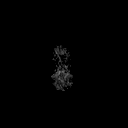

[Series 6: DWI · coronal · 3.0mm · 1.80mm/px · 3 of 47 slices shown (2 of 2)]
[im 1/47]
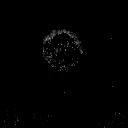
[im 24/47]
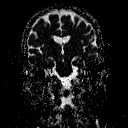
[im 47/47]
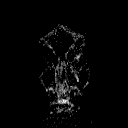

[Series 7: T2 · axial · 5.0mm · 0.60mm/px · z∈[-40,+114]mm · 2 of 25 slices shown (1 of 2)]
[im 1/25]
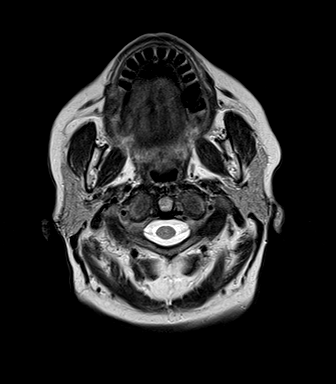
[im 25/25]
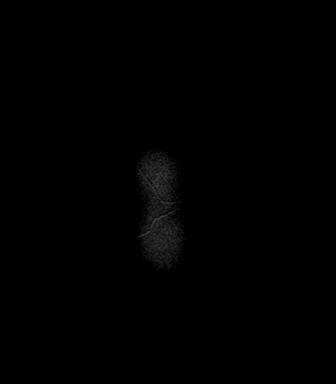

[Series 8: FLAIR · axial · 3.0mm · 0.45mm/px · z∈[-40,+114]mm · 3 of 53 slices shown]
[im 1/53]
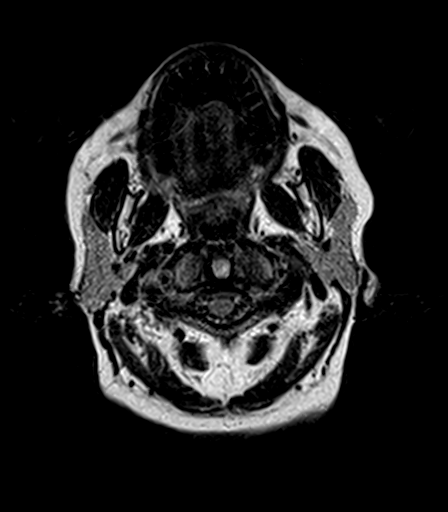
[im 27/53]
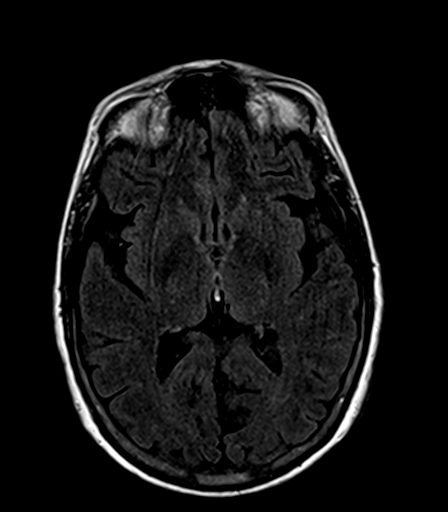
[im 53/53]
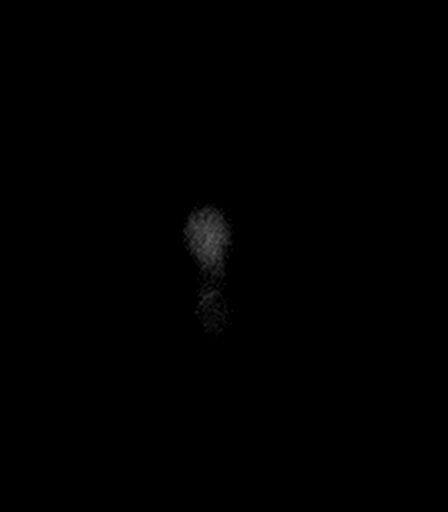

[Series 9: T2 · axial · 5.0mm · 0.45mm/px · z∈[-40,+114]mm · 2 of 25 slices shown (2 of 2)]
[im 1/25]
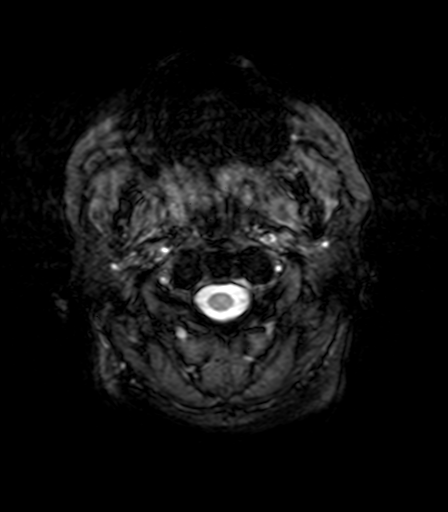
[im 25/25]
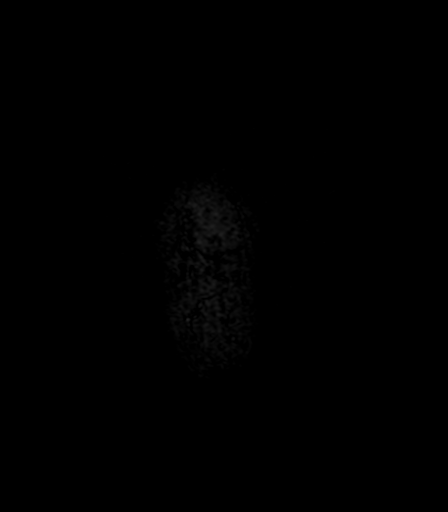

[Series 10: T1 · axial · 1.0mm · 1.00mm/px · z∈[-47,+126]mm · 11 of 176 slices shown (2 of 2)]
[im 1/176]
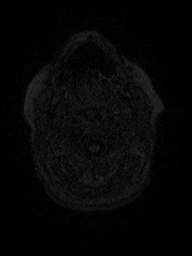
[im 18/176]
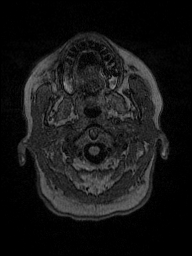
[im 36/176]
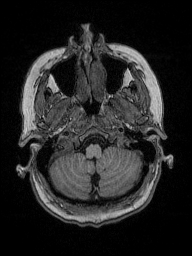
[im 53/176]
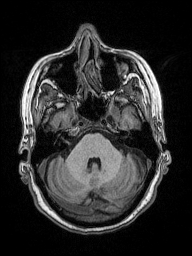
[im 71/176]
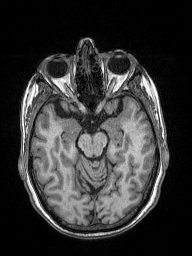
[im 88/176]
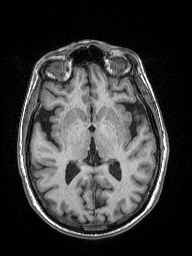
[im 106/176]
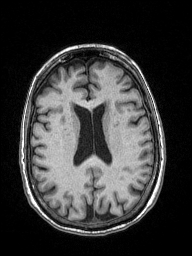
[im 123/176]
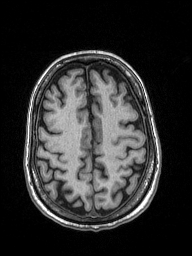
[im 141/176]
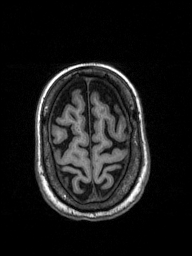
[im 158/176]
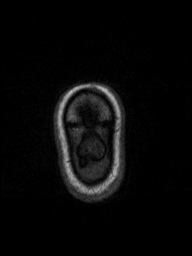
[im 176/176]
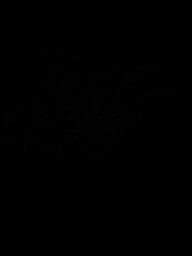

[Series 11: T2 post-contrast · coronal · 5.0mm · 0.49mm/px · 2 of 29 slices shown]
[im 1/29]
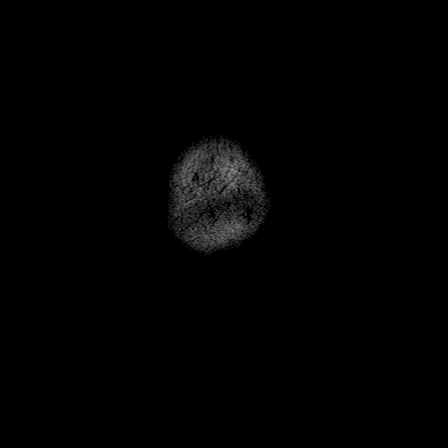
[im 29/29]
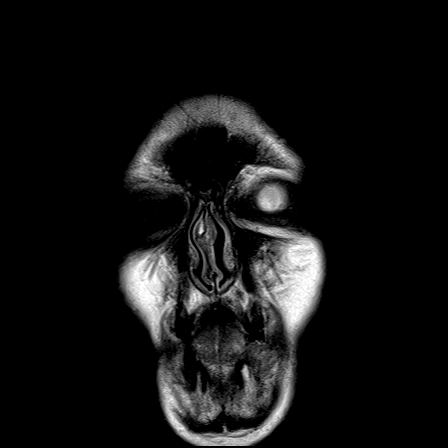

[Series 12: T1 post-contrast · axial · 1.0mm · 1.00mm/px · z∈[-47,+126]mm · 11 of 176 slices shown (1 of 2)]
[im 1/176]
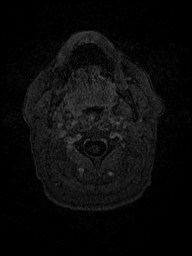
[im 18/176]
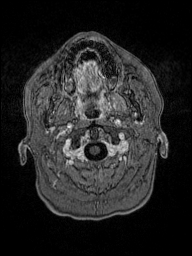
[im 36/176]
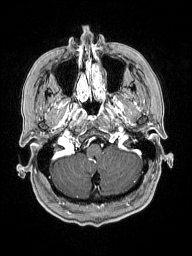
[im 53/176]
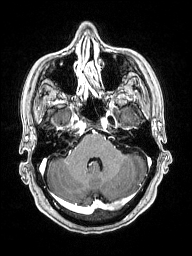
[im 71/176]
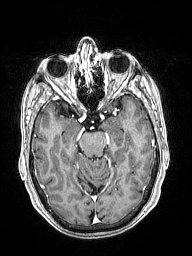
[im 88/176]
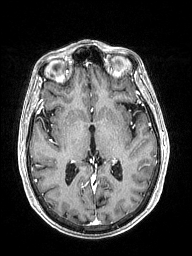
[im 106/176]
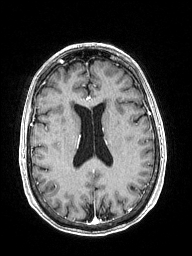
[im 123/176]
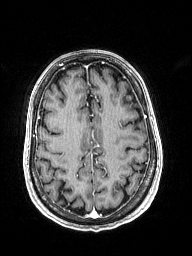
[im 141/176]
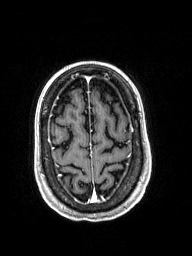
[im 158/176]
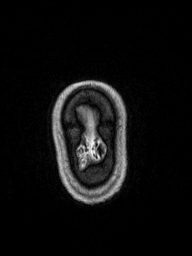
[im 176/176]
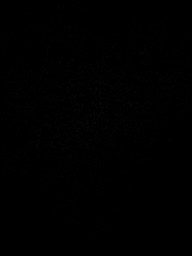

[Series 13: T1 post-contrast · coronal · 5.0mm · 0.43mm/px · 2 of 29 slices shown (2 of 2)]
[im 1/29]
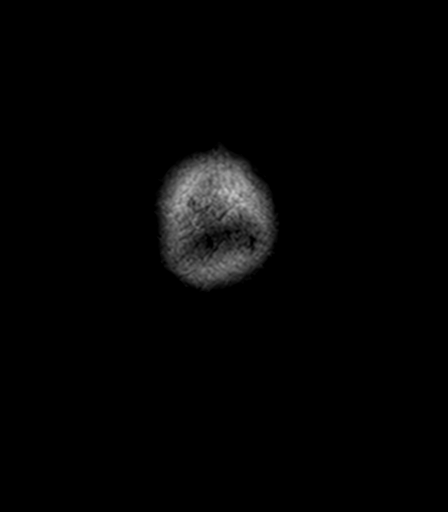
[im 29/29]
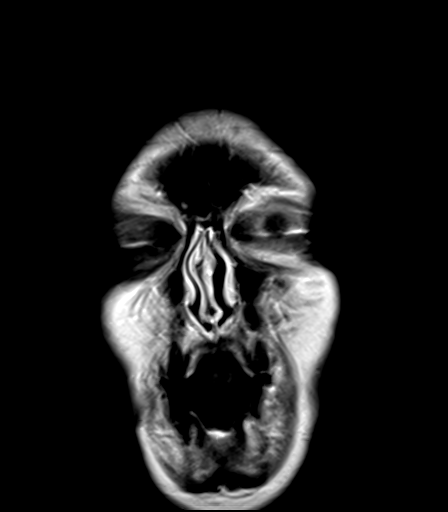

[Series 100: ax (id) · axial · 3.0mm · 1.80mm/px · z∈[-47,+113]mm · 4 of 55 slices shown]
[im 1/55]
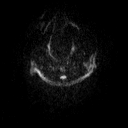
[im 19/55]
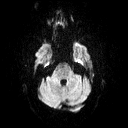
[im 37/55]
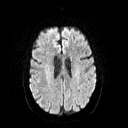
[im 55/55]
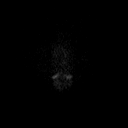

[Series 101: cor (id) · coronal · 3.0mm · 1.80mm/px · 3 of 47 slices shown]
[im 1/47]
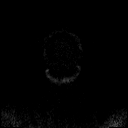
[im 24/47]
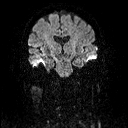
[im 47/47]
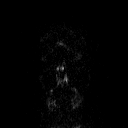

[48 of 48 positions shown; findings below may reference images not displayed]

FINDINGS: Brain: Cerebral volume is within normal limits for age. No
restricted diffusion to suggest acute infarction. No midline shift,
mass effect, evidence of mass lesion, ventriculomegaly, extra-axial
collection or acute intracranial hemorrhage. Cervicomedullary
junction and pituitary are within normal limits.

Scattered small foci of cerebral white matter T2 and FLAIR
hyperintensity, extent is mild for age. No cortical
encephalomalacia. Possible chronic microhemorrhage in the left
cerebellum (series 9, image 7). No other chronic cerebral blood
products identified. Deep gray matter nuclei and brainstem are
normal. No abnormal enhancement identified. No dural thickening.
Cavernous sinus appears normal.

Vascular: Major intracranial vascular flow voids are preserved.

Skull and upper cervical spine: Normal skullbase. Normal bone marrow
signal. Chronic disc and endplate degeneration in the cervical spine
appears typical for age. No osseous abnormality identified.

Sinuses/Orbits: Normal orbits soft tissues. Visualized paranasal
sinuses and mastoids are stable and well pneumatized.

Other: Visible internal auditory structures appear normal. Scalp and
face soft tissues appear normal.
IMPRESSION: 1. No acute intracranial abnormality and largely unremarkable for
age noncontrast MRI appearance of the brain.
2. Mild signal changes in the cerebral white matter and left
cerebellum compatible with chronic small vessel disease.

## 2018-04-08 IMAGING — CR DG CHEST 2V
2 series · 2 of 2 positions shown · non-contrast
Comparison: 04/08/2017 CXR

CLINICAL DATA: Chest pain and dyspnea. Patient on fluconazole for
Visad and developed symptoms 1 hour after taking medication.

EXAM:
CHEST  2 VIEW

[chest pa]
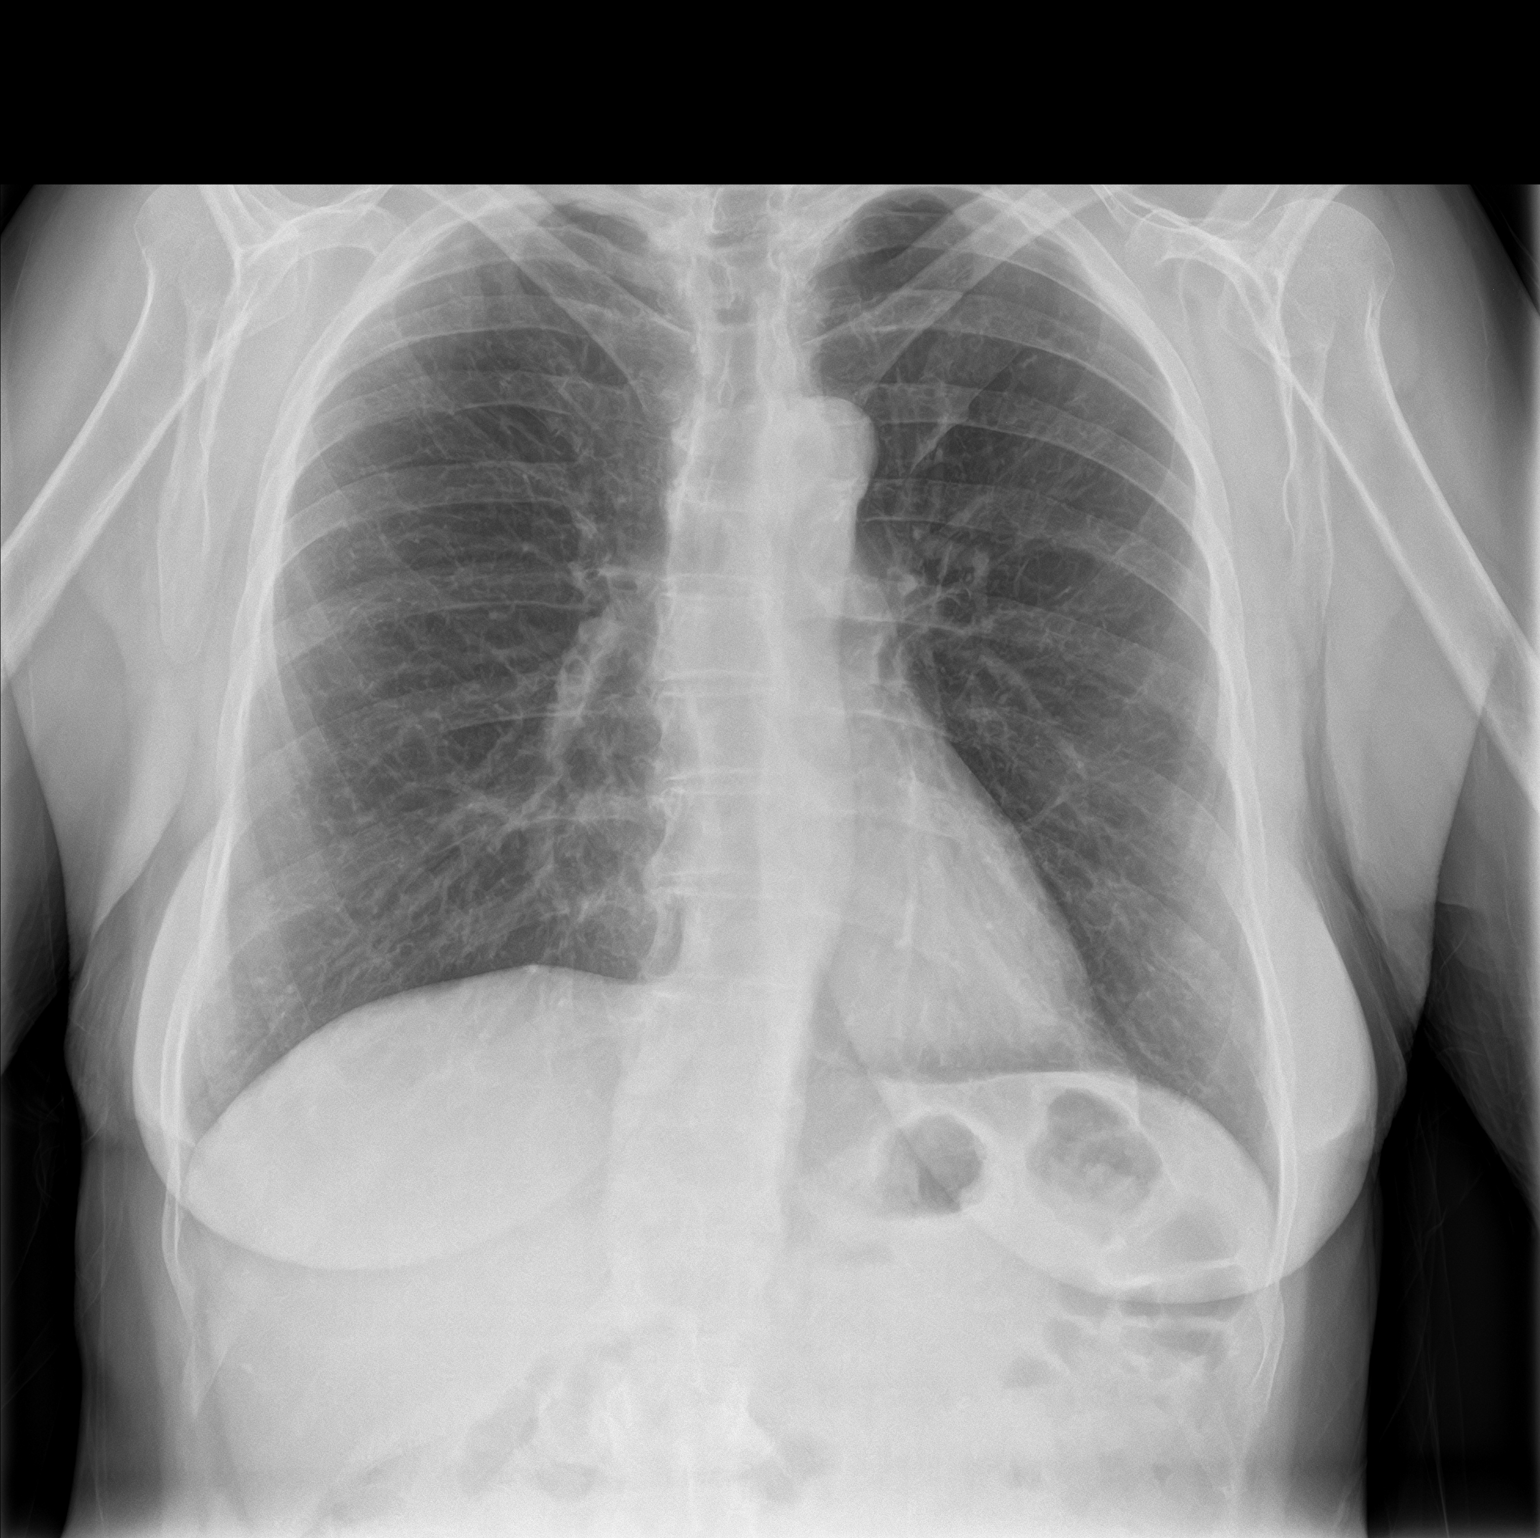

[chest lat]
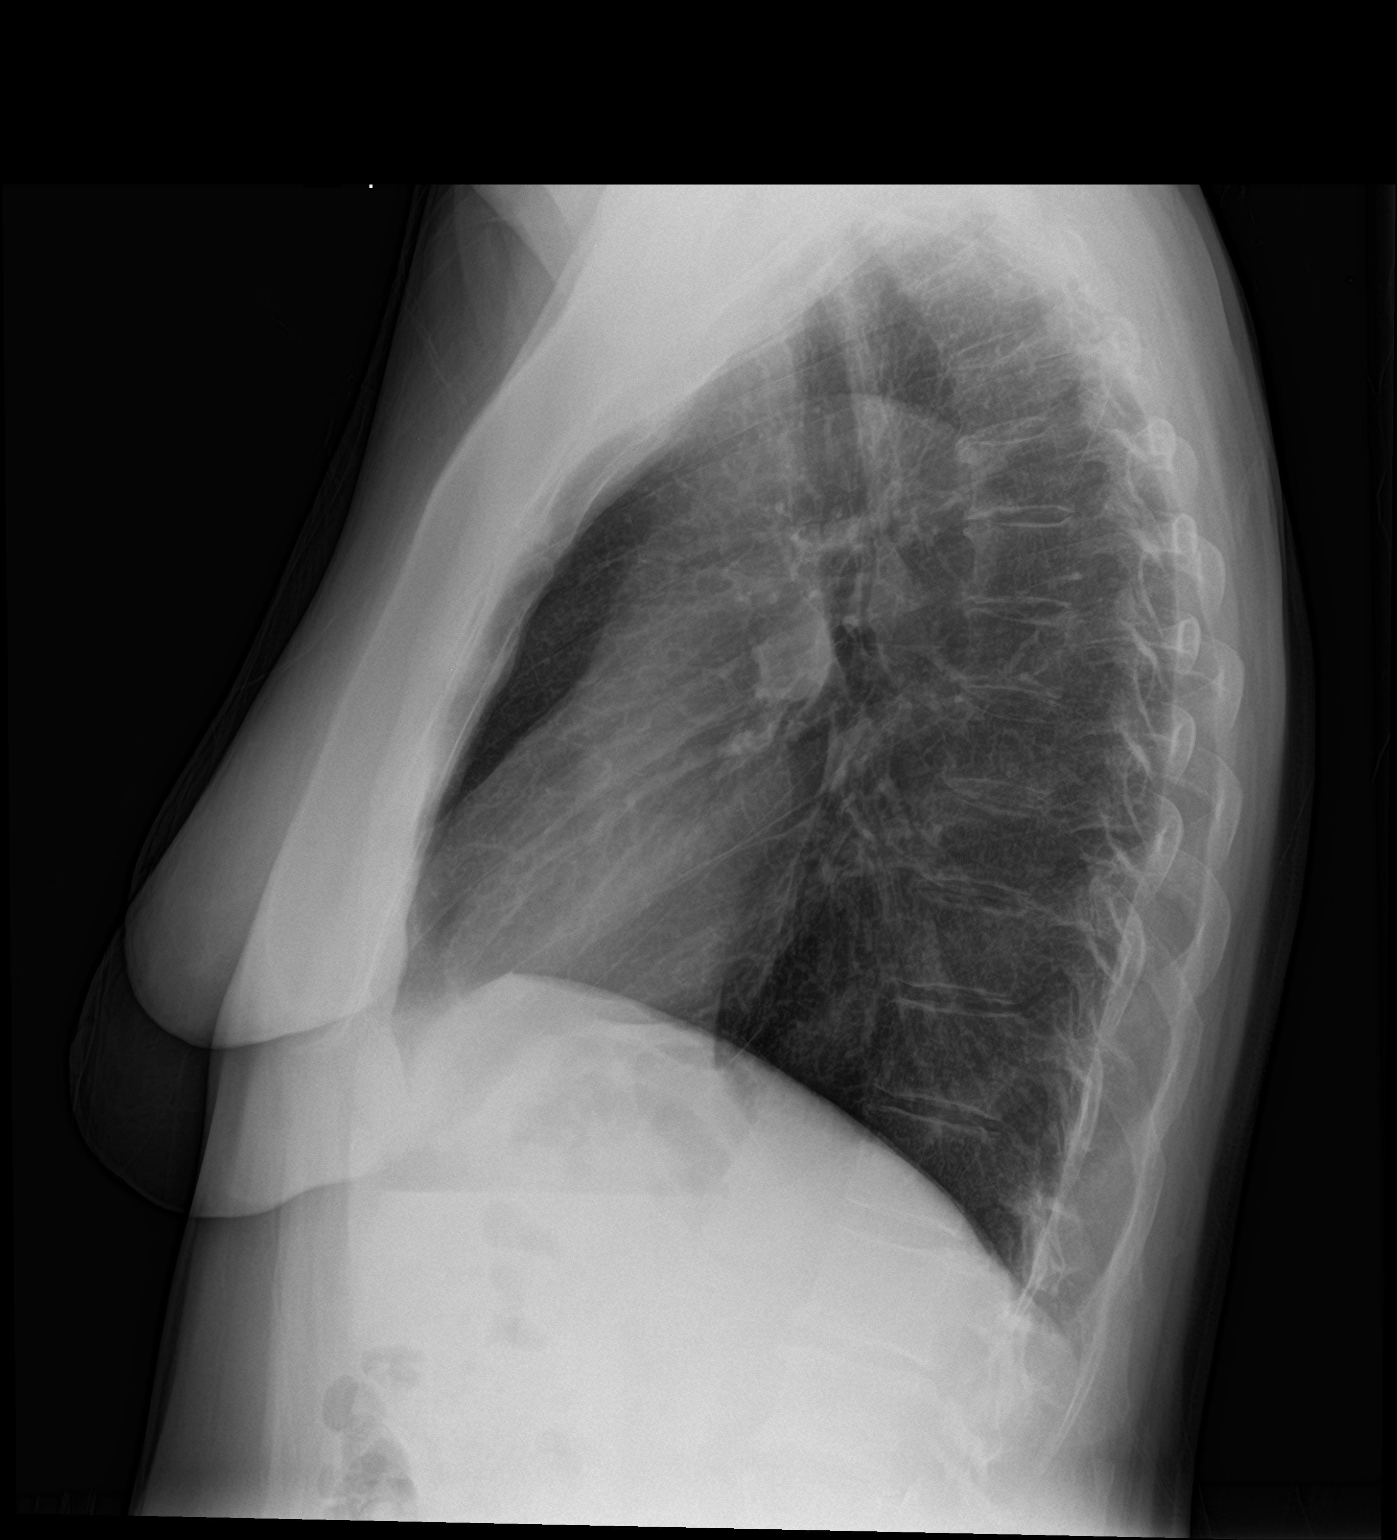

[2 of 2 positions shown; findings below may reference images not displayed]

FINDINGS: The heart size and mediastinal contours are within normal limits.
Atherosclerosis of the thoracic aortic arch. Both lungs are clear.
No acute nor suspicious osseous abnormalities. Mild degenerative
change of the upper thoracic spine with slight levoconvex curvature
of the lower thoracic spine.
IMPRESSION: No active cardiopulmonary disease.  Aortic atherosclerosis.

## 2023-12-08 ENCOUNTER — Other Ambulatory Visit: Payer: Self-pay

## 2023-12-08 ENCOUNTER — Inpatient Hospital Stay
Admission: EM | Admit: 2023-12-08 | Discharge: 2023-12-14 | DRG: 641 | Disposition: A | Discharge status: Skilled Nursing Facility | Payer: Medicare Other | Attending: Student | Admitting: Student

## 2023-12-08 ENCOUNTER — Emergency Department: Payer: Self-pay

## 2023-12-08 ENCOUNTER — Encounter: Payer: Self-pay | Admitting: Intensive Care

## 2023-12-08 DIAGNOSIS — K3189 Other diseases of stomach and duodenum: Secondary | ICD-10-CM | POA: Diagnosis present

## 2023-12-08 DIAGNOSIS — R627 Adult failure to thrive: Secondary | ICD-10-CM | POA: Diagnosis not present

## 2023-12-08 DIAGNOSIS — I959 Hypotension, unspecified: Secondary | ICD-10-CM | POA: Diagnosis present

## 2023-12-08 DIAGNOSIS — K5641 Fecal impaction: Secondary | ICD-10-CM

## 2023-12-08 DIAGNOSIS — R1319 Other dysphagia: Secondary | ICD-10-CM

## 2023-12-08 DIAGNOSIS — R630 Anorexia: Secondary | ICD-10-CM | POA: Diagnosis present

## 2023-12-08 DIAGNOSIS — R531 Weakness: Principal | ICD-10-CM

## 2023-12-08 DIAGNOSIS — Z880 Allergy status to penicillin: Secondary | ICD-10-CM

## 2023-12-08 DIAGNOSIS — Z860100 Personal history of colon polyps, unspecified: Secondary | ICD-10-CM

## 2023-12-08 DIAGNOSIS — K219 Gastro-esophageal reflux disease without esophagitis: Secondary | ICD-10-CM | POA: Diagnosis present

## 2023-12-08 DIAGNOSIS — E872 Acidosis, unspecified: Secondary | ICD-10-CM

## 2023-12-08 DIAGNOSIS — R404 Transient alteration of awareness: Secondary | ICD-10-CM

## 2023-12-08 DIAGNOSIS — Z6824 Body mass index (BMI) 24.0-24.9, adult: Secondary | ICD-10-CM

## 2023-12-08 DIAGNOSIS — E876 Hypokalemia: Secondary | ICD-10-CM

## 2023-12-08 DIAGNOSIS — K259 Gastric ulcer, unspecified as acute or chronic, without hemorrhage or perforation: Secondary | ICD-10-CM | POA: Diagnosis present

## 2023-12-08 DIAGNOSIS — E039 Hypothyroidism, unspecified: Secondary | ICD-10-CM | POA: Diagnosis present

## 2023-12-08 DIAGNOSIS — K2980 Duodenitis without bleeding: Secondary | ICD-10-CM | POA: Diagnosis present

## 2023-12-08 DIAGNOSIS — A419 Sepsis, unspecified organism: Secondary | ICD-10-CM

## 2023-12-08 DIAGNOSIS — K838 Other specified diseases of biliary tract: Secondary | ICD-10-CM | POA: Diagnosis present

## 2023-12-08 LAB — CBC WITH DIFFERENTIAL/PLATELET
Abs Immature Granulocytes: 0.06 10*3/uL (ref 0.00–0.07)
Basophils Absolute: 0 10*3/uL (ref 0.0–0.1)
Basophils Relative: 0 %
Eosinophils Absolute: 0.1 10*3/uL (ref 0.0–0.5)
Eosinophils Relative: 1 %
HCT: 51.8 % — ABNORMAL HIGH (ref 36.0–46.0)
Hemoglobin: 17.8 g/dL — ABNORMAL HIGH (ref 12.0–15.0)
Immature Granulocytes: 1 %
Lymphocytes Relative: 32 %
Lymphs Abs: 2 10*3/uL (ref 0.7–4.0)
MCH: 29.7 pg (ref 26.0–34.0)
MCHC: 34.4 g/dL (ref 30.0–36.0)
MCV: 86.5 fL (ref 80.0–100.0)
Monocytes Absolute: 0.6 10*3/uL (ref 0.1–1.0)
Monocytes Relative: 9 %
Neutro Abs: 3.5 10*3/uL (ref 1.7–7.7)
Neutrophils Relative %: 57 %
Platelets: 245 10*3/uL (ref 150–400)
RBC: 5.99 MIL/uL — ABNORMAL HIGH (ref 3.87–5.11)
RDW: 13.7 % (ref 11.5–15.5)
WBC: 6.1 10*3/uL (ref 4.0–10.5)
nRBC: 0.3 % — ABNORMAL HIGH (ref 0.0–0.2)

## 2023-12-08 LAB — RESP PANEL BY RT-PCR (RSV, FLU A&B, COVID)  RVPGX2
Influenza A by PCR: NEGATIVE
Influenza B by PCR: NEGATIVE
Resp Syncytial Virus by PCR: NEGATIVE
SARS Coronavirus 2 by RT PCR: NEGATIVE

## 2023-12-08 LAB — TYPE AND SCREEN
ABO/RH(D): AB POS
Antibody Screen: NEGATIVE

## 2023-12-08 LAB — COMPREHENSIVE METABOLIC PANEL
ALT: 13 U/L (ref 0–44)
AST: 19 U/L (ref 15–41)
Albumin: 3.9 g/dL (ref 3.5–5.0)
Alkaline Phosphatase: 76 U/L (ref 38–126)
Anion gap: 22 — ABNORMAL HIGH (ref 5–15)
BUN: 26 mg/dL — ABNORMAL HIGH (ref 8–23)
CO2: 18 mmol/L — ABNORMAL LOW (ref 22–32)
Calcium: 9.4 mg/dL (ref 8.9–10.3)
Chloride: 94 mmol/L — ABNORMAL LOW (ref 98–111)
Creatinine, Ser: 0.66 mg/dL (ref 0.44–1.00)
GFR, Estimated: >60 mL/min (ref >60)
Glucose, Bld: 102 mg/dL — ABNORMAL HIGH (ref 70–99)
Potassium: 3.2 mmol/L — ABNORMAL LOW (ref 3.5–5.1)
Sodium: 134 mmol/L — ABNORMAL LOW (ref 135–145)
Total Bilirubin: 2.2 mg/dL — ABNORMAL HIGH (ref 0.0–1.2)
Total Protein: 7 g/dL (ref 6.5–8.1)

## 2023-12-08 LAB — CK: Total CK: 17 U/L — ABNORMAL LOW (ref 38–234)

## 2023-12-08 LAB — APTT: aPTT: 29 s (ref 24–36)

## 2023-12-08 LAB — TROPONIN I (HIGH SENSITIVITY)
Troponin I (High Sensitivity): 7 ng/L (ref <18)
Troponin I (High Sensitivity): 6 ng/L (ref <18)

## 2023-12-08 LAB — LACTIC ACID, PLASMA
Lactic Acid, Venous: 2.8 mmol/L (ref 0.5–1.9)
Lactic Acid, Venous: 2.2 mmol/L (ref 0.5–1.9)

## 2023-12-08 LAB — PROTIME-INR
INR: 1.2 (ref 0.8–1.2)
Prothrombin Time: 15.7 s — ABNORMAL HIGH (ref 11.4–15.2)

## 2023-12-08 MED ORDER — SODIUM CHLORIDE 0.9 % IV SOLN
2.0000 g | Freq: Once | INTRAVENOUS | Status: AC
  Administered 2023-12-08: 2 g via INTRAVENOUS
  Filled 2023-12-08: qty 10

## 2023-12-08 MED ORDER — LACTATED RINGERS IV SOLN: INTRAVENOUS | Status: AC

## 2023-12-08 MED ORDER — METRONIDAZOLE 500 MG/100ML IV SOLN
500.0000 mg | Freq: Once | INTRAVENOUS | Status: AC
  Administered 2023-12-08: 500 mg via INTRAVENOUS
  Filled 2023-12-08: qty 100

## 2023-12-08 MED ORDER — VANCOMYCIN HCL IN DEXTROSE 1-5 GM/200ML-% IV SOLN
1000.0000 mg | Freq: Once | INTRAVENOUS | Status: AC
  Administered 2023-12-08: 1000 mg via INTRAVENOUS
  Filled 2023-12-08: qty 200

## 2023-12-08 MED ORDER — IOHEXOL 300 MG/ML  SOLN
100.0000 mL | Freq: Once | INTRAMUSCULAR | Status: AC | PRN
  Administered 2023-12-08: 100 mL via INTRAVENOUS

## 2023-12-08 NOTE — ED Notes (Signed)
 While being triaged patient became unresponsive. Did not respond to sternal rub and appeared to not be breathing at that time. Provider and nurse checking for pulse in triage and no pulse found. Patient immediately taken to room 16.  Once patient was lifted to stretcher, patient had a gasp for air and respirations noted. Patient then opened eyes and responding

## 2023-12-08 NOTE — Progress Notes (Signed)
 Elink following for sepsis protocol.

## 2023-12-08 NOTE — Sepsis Progress Note (Signed)
 Notified bedside nurse of need to draw 2nd lactic acid.

## 2023-12-08 NOTE — ED Notes (Signed)
 Returned to room from CT.

## 2023-12-08 NOTE — ED Provider Notes (Addendum)
 [facility] Provider Note    Event Date/Time   First MD Initiated Contact with Patient 12/08/23 1706     (approximate)   History   Weakness and Dysphagia   HPI  Marilyn Castillo is a 78 y.o. female presents to the emergency department following an episode of hypotension.  Patient states that she has been weak and not been feeling well or able to eat over the past 1 week.  She is uncertain of the last time she got out of bed or took a shower.  When she went to check into the emergency department patient was ill-appearing and then had an episode when they were moving her from wheelchair where she became unresponsive.  Blood pressure was 36/24 patient did not have any pulses and was nonresponsive to sternal rub.  Taken immediately back to a room.  Patient was then back with spontaneous respirations.  Initial vital signs repeat with 70 systolic and then quickly returned back to normal at 113/79.  Patient was tachycardic and hypothermic.  Does endorse some abdominal pain.  Denies any dysuria, urinary urgency or frequency.       Physical Exam   Triage Vital Signs: ED Triage Vitals  Encounter Vitals Group     BP 12/08/23 1658 (!) 36/24     Systolic BP Percentile --      Diastolic BP Percentile --      Pulse Rate 12/08/23 1655 (!) 108     Resp 12/08/23 1655 (!) 22     Temp 12/08/23 1655 (!) 97.4 F (36.3 C)     Temp Source 12/08/23 1655 Oral     SpO2 12/08/23 1655 100 %     Weight --      Height --      Head Circumference --      Peak Flow --      Pain Score --      Pain Loc --      Pain Education --      Exclude from Growth Chart --     Most recent vital signs: Vitals:   12/08/23 2200 12/08/23 2230  BP: 116/70 117/79  Pulse: 91 91  Resp: 13 13  Temp:    SpO2: 95% 95%    Physical Exam Constitutional:      Appearance: She is well-developed. She is ill-appearing.  HENT:     Head: Atraumatic.  Eyes:     Extraocular Movements: Extraocular  movements intact.     Conjunctiva/sclera: Conjunctivae normal.  Cardiovascular:     Rate and Rhythm: Regular rhythm. Tachycardia present.     Heart sounds: No murmur heard. Pulmonary:     Effort: No respiratory distress.  Abdominal:     General: There is no distension.     Tenderness: There is no abdominal tenderness.  Musculoskeletal:        General: Normal range of motion.     Cervical back: Normal range of motion.  Skin:    General: Skin is warm.     Capillary Refill: Capillary refill takes 2 to 3 seconds.  Neurological:     Mental Status: She is alert. Mental status is at baseline.  Psychiatric:        Mood and Affect: Mood normal.     IMPRESSION / MDM / ASSESSMENT AND PLAN / ED COURSE  I reviewed the triage vital signs and the nursing notes.  Differential diagnosis including dysrhythmia, sepsis, vagal episode, intracranial hemorrhage, CVA, electrolyte abnormality, dehydration  EKG  I, Clotilda Punter, the attending physician, personally viewed and interpreted this ECG.   Rate: Normal  Rhythm: Normal sinus  Axis: Normal  Intervals: Normal  ST&T Change: None  No tachycardic or bradycardic dysrhythmias while on cardiac telemetry.  RADIOLOGY I independently reviewed imaging, my interpretation of imaging: CT scan of the head with no signs of intracranial hemorrhage or CVA.  CT scan of the chest abdomen and pelvis with contrast obtained -no acute intrathoracic abnormalities.  Dilated common bile duct to 11 mm.  Normal LFTs.  Fecal impaction.  Uterine fibroid.  LABS (all labs ordered are listed, but only abnormal results are displayed) Labs interpreted as -    Labs Reviewed  LACTIC ACID, PLASMA - Abnormal; Notable for the following components:      Result Value   Lactic Acid, Venous 2.8 (*)    All other components within normal limits  LACTIC ACID, PLASMA - Abnormal; Notable for the following components:   Lactic Acid, Venous 2.2 (*)    All other components within  normal limits  COMPREHENSIVE METABOLIC PANEL - Abnormal; Notable for the following components:   Sodium 134 (*)    Potassium 3.2 (*)    Chloride 94 (*)    CO2 18 (*)    Glucose, Bld 102 (*)    BUN 26 (*)    Total Bilirubin 2.2 (*)    Anion gap 22 (*)    All other components within normal limits  CBC WITH DIFFERENTIAL/PLATELET - Abnormal; Notable for the following components:   RBC 5.99 (*)    Hemoglobin 17.8 (*)    HCT 51.8 (*)    nRBC 0.3 (*)    All other components within normal limits  PROTIME-INR - Abnormal; Notable for the following components:   Prothrombin Time 15.7 (*)    All other components within normal limits  CK - Abnormal; Notable for the following components:   Total CK 17 (*)    All other components within normal limits  RESP PANEL BY RT-PCR (RSV, FLU A&B, COVID)  RVPGX2  CULTURE, BLOOD (ROUTINE X 2)  CULTURE, BLOOD (ROUTINE X 2)  APTT  URINALYSIS, W/ REFLEX TO CULTURE (INFECTION SUSPECTED)  TYPE AND SCREEN  TROPONIN I (HIGH SENSITIVITY)  TROPONIN I (HIGH SENSITIVITY)     MDM  Patient started on broad-spectrum antibiotics given concern for sepsis given her hypothermia, hypotension and tachycardia.  Blood cultures obtained.  Given broad-spectrum antibiotics to treat unknown cause but did have a severe penicillin allergy with anaphylaxis.  Patient was placed on a Lawyer.  Given 30 cc/kg of IV fluids.  CT scan of the head without signs of intracranial hemorrhage or infarction.  CT scan of the chest abdomen pelvis did show a mildly dilated common bile duct however patient has normal LFTs.  On reevaluation significant improvement of her symptoms, states she is feeling much better.  Fecal disimpaction, no large stool ball but did not have a large bowel movement.  No melanotic stool.  Lactic acid was elevated and downtrending.  Consulted hospitalist for admission for concern for sepsis.      PROCEDURES:  Critical Care performed: yes  .Critical  Care  Performed by: Punter Clotilda, MD Authorized by: Punter Clotilda, MD   Critical care provider statement:    Critical care time (minutes):  45   Critical care time was exclusive of:  Separately billable procedures and treating other patients   Critical care was necessary to treat or prevent imminent or life-threatening  deterioration of the following conditions:  Sepsis   Critical care was time spent personally by me on the following activities:  Development of treatment plan with patient or surrogate, discussions with consultants, evaluation of patient's response to treatment, examination of patient, ordering and review of laboratory studies, ordering and review of radiographic studies, ordering and performing treatments and interventions, pulse oximetry, re-evaluation of patient's condition and review of old charts .Fecal disimpaction  Date/Time: 12/09/2023 12:03 AM  Performed by: Suzanne Kirsch, MD Authorized by: Suzanne Kirsch, MD  Consent: Verbal consent obtained. Risks and benefits: risks, benefits and alternatives were discussed Consent given by: patient Patient understanding: patient states understanding of the procedure being performed Patient consent: the patient's understanding of the procedure matches consent given Procedure consent: procedure consent matches procedure scheduled Relevant documents: relevant documents present and verified Imaging studies: imaging studies available Required items: required blood products, implants, devices, and special equipment available Patient identity confirmed: verbally with patient and hospital-assigned identification number Preparation: Patient was prepped and draped in the usual sterile fashion. Local anesthesia used: no  Anesthesia: Local anesthesia used: no  Sedation: Patient sedated: no  Patient tolerance: patient tolerated the procedure well with no immediate complications     Patient's presentation is most consistent with  acute presentation with potential threat to life or bodily function.   MEDICATIONS ORDERED IN ED: Medications  lactated ringers  infusion ( Intravenous New Bag/Given 12/08/23 1726)  aztreonam  (AZACTAM ) 2 g in sodium chloride  0.9 % 100 mL IVPB (0 g Intravenous Stopped 12/08/23 1856)  metroNIDAZOLE  (FLAGYL ) IVPB 500 mg (0 mg Intravenous Stopped 12/08/23 1952)  vancomycin  (VANCOCIN ) IVPB 1000 mg/200 mL premix (0 mg Intravenous Stopped 12/08/23 1956)  iohexol  (OMNIPAQUE ) 300 MG/ML solution 100 mL (100 mLs Intravenous Contrast Given 12/08/23 1918)    FINAL CLINICAL IMPRESSION(S) / ED DIAGNOSES   Final diagnoses:  Weakness  Hypotensive episode     Rx / DC Orders   ED Discharge Orders     None        Note:  This document was prepared using Dragon voice recognition software and may include unintentional dictation errors.   Suzanne Kirsch, MD 12/09/23 MIKI    Suzanne Kirsch, MD 12/09/23 [id number]

## 2023-12-08 NOTE — ED Notes (Signed)
 This RN and [employer] provided peri care and applied a brief to this pt after a episode of incontinence. Full linen change was given. Pt was able to follow commands and tolerated the change well. Bed locked and in lowest position. Husband is at bedside.

## 2023-12-08 NOTE — ED Notes (Signed)
 Bear Warmer applied to pt by [employer].

## 2023-12-08 NOTE — Consult Note (Signed)
 CODE SEPSIS - PHARMACY COMMUNICATION  **Broad Spectrum Antibiotics should be administered within 1 hour of Sepsis diagnosis**  Time Code Sepsis Called/Page Received: 1/3 @ 1713  Antibiotics Ordered:  Aztreonam  2g IV x 1  Metronidazole  500 mg IV x 1 Vancomycin  1g IV x 1  Time of 1st antibiotic administration: 1/3 @ 1747  Additional action taken by pharmacy: NA  If necessary, Name of Provider/Nurse Contacted: NA  Marilyn Castillo, PharmD Pharmacy Resident  12/08/2023 5:24 PM

## 2023-12-08 NOTE — ED Triage Notes (Signed)
 Arrived by EMS from home. Husband reports patient unable to eat and drink for days. Also c/o weakness. EMS reports strong urine odor  A&O x4 per EMS  EMS vitals:  152/79 b/p 98%O2 77Pulse 83CBG

## 2023-12-08 NOTE — ED Provider Triage Note (Signed)
 Emergency Medicine Provider Triage Evaluation Note  Marilyn Castillo , a 78 y.o. female  was evaluated in triage.  Pt complains of difficulty swallowing.  Per husband patient has not eaten in the last 5 days.  During the interview patient stopped talking.  Patient denies dysuria, sick contacts.  Patient was moved to room ED 16 A preparing for CPR but patient came back and started talking.   Review of Systems  Positive:  Negative:   Physical Exam  BP (!) 36/24 (BP Location: Right Arm)   Pulse (!) 108   Temp (!) 97.4 F (36.3 C) (Oral)   Resp (!) 22   SpO2 100%  Gen:   Awake, no distress  Resp:  Normal effort  MSK:   Moves extremities without difficulty  Other:    Medical Decision Making  Medically screening exam initiated at 5:05 PM.  Appropriate orders placed.  Durenda Tutton was informed that the remainder of the evaluation will be completed by another provider, this initial triage assessment does not replace that evaluation, and the importance of remaining in the ED until their evaluation is complete.     Janit Kast, PA-C 12/08/23 1707

## 2023-12-09 DIAGNOSIS — R630 Anorexia: Secondary | ICD-10-CM | POA: Diagnosis present

## 2023-12-09 DIAGNOSIS — K3189 Other diseases of stomach and duodenum: Secondary | ICD-10-CM | POA: Diagnosis present

## 2023-12-09 DIAGNOSIS — I959 Hypotension, unspecified: Secondary | ICD-10-CM | POA: Diagnosis present

## 2023-12-09 DIAGNOSIS — K838 Other specified diseases of biliary tract: Secondary | ICD-10-CM | POA: Diagnosis present

## 2023-12-09 DIAGNOSIS — K219 Gastro-esophageal reflux disease without esophagitis: Secondary | ICD-10-CM | POA: Diagnosis present

## 2023-12-09 DIAGNOSIS — Z880 Allergy status to penicillin: Secondary | ICD-10-CM | POA: Diagnosis not present

## 2023-12-09 DIAGNOSIS — R634 Abnormal weight loss: Secondary | ICD-10-CM | POA: Diagnosis not present

## 2023-12-09 DIAGNOSIS — E872 Acidosis, unspecified: Secondary | ICD-10-CM

## 2023-12-09 DIAGNOSIS — R627 Adult failure to thrive: Secondary | ICD-10-CM | POA: Diagnosis present

## 2023-12-09 DIAGNOSIS — R404 Transient alteration of awareness: Secondary | ICD-10-CM | POA: Diagnosis not present

## 2023-12-09 DIAGNOSIS — K5641 Fecal impaction: Secondary | ICD-10-CM | POA: Diagnosis present

## 2023-12-09 DIAGNOSIS — A419 Sepsis, unspecified organism: Secondary | ICD-10-CM

## 2023-12-09 DIAGNOSIS — R531 Weakness: Secondary | ICD-10-CM | POA: Diagnosis not present

## 2023-12-09 DIAGNOSIS — K259 Gastric ulcer, unspecified as acute or chronic, without hemorrhage or perforation: Secondary | ICD-10-CM | POA: Diagnosis present

## 2023-12-09 DIAGNOSIS — E876 Hypokalemia: Secondary | ICD-10-CM | POA: Diagnosis present

## 2023-12-09 DIAGNOSIS — Z6824 Body mass index (BMI) 24.0-24.9, adult: Secondary | ICD-10-CM | POA: Diagnosis not present

## 2023-12-09 DIAGNOSIS — Z860100 Personal history of colon polyps, unspecified: Secondary | ICD-10-CM | POA: Diagnosis not present

## 2023-12-09 DIAGNOSIS — R1310 Dysphagia, unspecified: Secondary | ICD-10-CM | POA: Diagnosis not present

## 2023-12-09 DIAGNOSIS — E039 Hypothyroidism, unspecified: Secondary | ICD-10-CM | POA: Diagnosis present

## 2023-12-09 DIAGNOSIS — Z8719 Personal history of other diseases of the digestive system: Secondary | ICD-10-CM | POA: Diagnosis not present

## 2023-12-09 DIAGNOSIS — K2980 Duodenitis without bleeding: Secondary | ICD-10-CM | POA: Diagnosis present

## 2023-12-09 LAB — BASIC METABOLIC PANEL
Anion gap: 17 — ABNORMAL HIGH (ref 5–15)
BUN: 23 mg/dL (ref 8–23)
CO2: 22 mmol/L (ref 22–32)
Calcium: 8.6 mg/dL — ABNORMAL LOW (ref 8.9–10.3)
Chloride: 98 mmol/L (ref 98–111)
Creatinine, Ser: 0.71 mg/dL (ref 0.44–1.00)
GFR, Estimated: >60 mL/min (ref >60)
Glucose, Bld: 130 mg/dL — ABNORMAL HIGH (ref 70–99)
Potassium: 3.2 mmol/L — ABNORMAL LOW (ref 3.5–5.1)
Sodium: 137 mmol/L (ref 135–145)

## 2023-12-09 LAB — CBC
HCT: 41.5 % (ref 36.0–46.0)
Hemoglobin: 14.4 g/dL (ref 12.0–15.0)
MCH: 29.6 pg (ref 26.0–34.0)
MCHC: 34.7 g/dL (ref 30.0–36.0)
MCV: 85.4 fL (ref 80.0–100.0)
Platelets: 204 10*3/uL (ref 150–400)
RBC: 4.86 MIL/uL (ref 3.87–5.11)
RDW: 13.6 % (ref 11.5–15.5)
WBC: 6.4 10*3/uL (ref 4.0–10.5)
nRBC: 0.3 % — ABNORMAL HIGH (ref 0.0–0.2)

## 2023-12-09 LAB — URINALYSIS, W/ REFLEX TO CULTURE (INFECTION SUSPECTED)
Bacteria, UA: NONE SEEN
Bilirubin Urine: NEGATIVE
Glucose, UA: NEGATIVE mg/dL
Hgb urine dipstick: NEGATIVE
Ketones, ur: 5 mg/dL — AB
Leukocytes,Ua: NEGATIVE
Nitrite: NEGATIVE
Protein, ur: NEGATIVE mg/dL
Specific Gravity, Urine: >1.046 — ABNORMAL HIGH (ref 1.005–1.030)
pH: 5 (ref 5.0–8.0)

## 2023-12-09 LAB — MAGNESIUM
Magnesium: 1.6 mg/dL — ABNORMAL LOW (ref 1.7–2.4)
Magnesium: 2.4 mg/dL (ref 1.7–2.4)

## 2023-12-09 MED ORDER — DOCUSATE SODIUM 100 MG PO CAPS
100.0000 mg | ORAL_CAPSULE | Freq: Two times a day (BID) | ORAL | Status: DC
  Administered 2023-12-09 – 2023-12-11 (×4): 100 mg via ORAL
  Filled 2023-12-09 (×5): qty 1

## 2023-12-09 MED ORDER — METRONIDAZOLE 500 MG/100ML IV SOLN
500.0000 mg | Freq: Two times a day (BID) | INTRAVENOUS | Status: DC
  Administered 2023-12-09 – 2023-12-10 (×3): 500 mg via INTRAVENOUS
  Filled 2023-12-09 (×3): qty 100

## 2023-12-09 MED ORDER — ACETAMINOPHEN 325 MG PO TABS: 650.0000 mg | ORAL_TABLET | Freq: Four times a day (QID) | ORAL | Status: DC | PRN

## 2023-12-09 MED ORDER — SODIUM CHLORIDE 0.9 % IV SOLN
1.0000 g | Freq: Three times a day (TID) | INTRAVENOUS | Status: DC
  Filled 2023-12-09: qty 5

## 2023-12-09 MED ORDER — ACETAMINOPHEN 650 MG RE SUPP: 650.0000 mg | Freq: Four times a day (QID) | RECTAL | Status: DC | PRN

## 2023-12-09 MED ORDER — VANCOMYCIN HCL 1250 MG/250ML IV SOLN
1250.0000 mg | INTRAVENOUS | Status: DC
  Administered 2023-12-09: 1250 mg via INTRAVENOUS
  Filled 2023-12-09: qty 250

## 2023-12-09 MED ORDER — ENSURE ENLIVE PO LIQD
237.0000 mL | Freq: Two times a day (BID) | ORAL | Status: DC
  Administered 2023-12-10: 237 mL via ORAL

## 2023-12-09 MED ORDER — SENNOSIDES-DOCUSATE SODIUM 8.6-50 MG PO TABS: 1.0000 | ORAL_TABLET | Freq: Every evening | ORAL | Status: DC | PRN

## 2023-12-09 MED ORDER — SODIUM CHLORIDE 0.9 % IV SOLN
1.0000 g | Freq: Three times a day (TID) | INTRAVENOUS | Status: DC
  Administered 2023-12-09 – 2023-12-10 (×4): 1 g via INTRAVENOUS
  Filled 2023-12-09 (×5): qty 5

## 2023-12-09 MED ORDER — POTASSIUM CHLORIDE IN NACL 20-0.9 MEQ/L-% IV SOLN
INTRAVENOUS | Status: AC
  Filled 2023-12-09 (×2): qty 1000

## 2023-12-09 MED ORDER — VANCOMYCIN HCL 750 MG/150ML IV SOLN
750.0000 mg | Freq: Once | INTRAVENOUS | Status: AC
  Administered 2023-12-09: 750 mg via INTRAVENOUS
  Filled 2023-12-09: qty 150

## 2023-12-09 MED ORDER — POLYETHYLENE GLYCOL 3350 17 G PO PACK
17.0000 g | PACK | Freq: Two times a day (BID) | ORAL | Status: DC
  Administered 2023-12-09 – 2023-12-12 (×2): 17 g via ORAL
  Filled 2023-12-09 (×3): qty 1

## 2023-12-09 MED ORDER — PANTOPRAZOLE SODIUM 40 MG IV SOLR
40.0000 mg | INTRAVENOUS | Status: DC
  Administered 2023-12-09 – 2023-12-10 (×2): 40 mg via INTRAVENOUS
  Filled 2023-12-09 (×2): qty 10

## 2023-12-09 MED ORDER — MAGNESIUM SULFATE 2 GM/50ML IV SOLN
2.0000 g | Freq: Once | INTRAVENOUS | Status: AC
  Administered 2023-12-09: 2 g via INTRAVENOUS
  Filled 2023-12-09: qty 50

## 2023-12-09 MED ORDER — ENOXAPARIN SODIUM 40 MG/0.4ML IJ SOSY
40.0000 mg | PREFILLED_SYRINGE | INTRAMUSCULAR | Status: DC
  Administered 2023-12-09 – 2023-12-14 (×5): 40 mg via SUBCUTANEOUS
  Filled 2023-12-09 (×5): qty 0.4

## 2023-12-09 MED ORDER — FLEET ENEMA RE ENEM
1.0000 | ENEMA | Freq: Once | RECTAL | Status: AC
  Administered 2023-12-09: 1 via RECTAL

## 2023-12-09 NOTE — Evaluation (Addendum)
 Clinical/Bedside Swallow Evaluation Patient Details  Name: Marilyn Castillo MRN: [id number] Date of Birth: [birth year]  Today's Date: 12/09/2023 Time: SLP Start Time (ACUTE ONLY): 1315 SLP Stop Time (ACUTE ONLY): 1335 SLP Time Calculation (min) (ACUTE ONLY): 20 min  Past Medical History:  Past Medical History:  Diagnosis Date   Acid reflux    Hypothyroidism    Past Surgical History:  Past Surgical History:  Procedure Laterality Date   COLONOSCOPY WITH PROPOFOL  N/A 09/27/2017   Procedure: COLONOSCOPY WITH PROPOFOL ;  Surgeon: Toledo, Ladell POUR, MD;  Location: [facility] ENDOSCOPY;  Service: Gastroenterology;  Laterality: N/A;   ESOPHAGOGASTRODUODENOSCOPY (EGD) WITH PROPOFOL  N/A 09/27/2017   Procedure: ESOPHAGOGASTRODUODENOSCOPY (EGD) WITH PROPOFOL ;  Surgeon: Toledo, Ladell POUR, MD;  Location: [facility] ENDOSCOPY;  Service: Gastroenterology;  Laterality: N/A;   TUBAL LIGATION     HPI:  78 year old female with past medical history significant for hypothyroidism and GERD.  Per patient she has had difficulty swallowing for the last couple of months.  She has difficulty swallowing both liquids and solids.  She has seen her PCP but she is not been to see a gastroenterologist.  For the last week she has been drinking mainly water.  She takes no Ensure.  She reports weight loss and getting progressively weaker.  She denies nausea, vomiting, diarrhea constipation.  Pt currently NPO. Head CT 12/08/23: No acute intracranial abnormality. CXR 12/08/23: No active disease.    Assessment / Plan / Recommendation  Clinical Impression  Pt seen for bedside swallow assessment in the setting of report of dysphagia. Pt seen with trials of thin liquids (via straw), puree, and regular solids. No overt or subtle s/sx pharyngeal dysphagia noted. No change to vocal quality across trials. Vitals stable for duration of trials. Oral phase grossly intact- with complete manipulation and clearance of regular solid from oral cavity. Pt with hx  of GERD per chart and was noted to belch intermittently during solids trials. Pt denied globus sensation, though did report intermittent stuck feeling with PO intake recently. Pt with potential esophageal dysphagia component- recommend follow up with GI. Based on age and current debility, pt is at mild of aspiration, therefore recommend aspiration precautions (slow rate, small bites, elevated HOB, and alert for PO intake). Further, recommend elevated HOB for at least 30 min following PO intake. Recommend unrestricted diet, with pt selection of soft solids as needed. MD and RN aware of recommendations. No further SLP services indicated. SLP Visit Diagnosis: Dysphagia, unspecified (R13.10)    Aspiration Risk  Mild aspiration risk    Diet Recommendation   Thin;Age appropriate regular  Medication Administration: Whole meds with liquid    Other  Recommendations Oral Care Recommendations: Oral care BID    Recommendations for follow up therapy are one component of a multi-disciplinary discharge planning process, led by the attending physician.  Recommendations may be updated based on patient status, additional functional criteria and insurance authorization.  Follow up Recommendations No SLP follow up         Functional Status Assessment Patient has not had a recent decline in their functional status    Swallow Study   General Date of Onset: 12/09/23 HPI: 78 year old female with past medical history significant for hypothyroidism and GERD.  Per patient she has had difficulty swallowing for the last couple of months.  She has difficulty swallowing both liquids and solids.  She has seen her PCP but she is not been to see a gastroenterologist.  For the last week she has been drinking mainly  water.  She takes no Ensure.  She reports weight loss and getting progressively weaker.  She denies nausea, vomiting, diarrhea constipation.  Pt currently NPO. Head CT 12/08/23: No acute intracranial abnormality.  CXR 12/08/23: No active disease. Type of Study: Bedside Swallow Evaluation Previous Swallow Assessment: none in chart Diet Prior to this Study: NPO Temperature Spikes Noted: No Respiratory Status: Room air History of Recent Intubation: No Behavior/Cognition: Alert;Cooperative Oral Cavity Assessment: Dry Oral Care Completed by SLP: Recent completion by staff Oral Cavity - Dentition: Adequate natural dentition;Poor condition Vision: Functional for self-feeding Self-Feeding Abilities: Able to feed self Patient Positioning: Upright in bed Baseline Vocal Quality: Normal Volitional Cough: Strong Volitional Swallow: Able to elicit    Oral/Motor/Sensory Function Overall Oral Motor/Sensory Function: Within functional limits   Ice Chips Ice chips: Not tested   Thin Liquid Thin Liquid: Within functional limits    Nectar Thick Nectar Thick Liquid: Not tested   Honey Thick Honey Thick Liquid: Not tested   Puree Puree: Within functional limits   Solid     Solid: Within functional limits     Marilyn Tholl Clapp  MS [facility] SLP   Marilyn Castillo 12/09/2023,1:58 PM

## 2023-12-09 NOTE — ED Notes (Signed)
 Pt had watery BM, pt cleaned, peri clean performed, new chux and gown applied to pt. Cathter care performed, MD aware of watery stool, miralax to be rescheduled due to last admin time.

## 2023-12-09 NOTE — ED Notes (Signed)
 This RN cleaned patient up at this time. Patient had a large loose stool. Warm blankets provided and a breif was placed on the patient.

## 2023-12-09 NOTE — Consult Note (Signed)
 CARDIOLOGY CONSULT NOTE               Patient ID: Marilyn Castillo MRN: [id number] DOB/AGE: 1946/05/10 78 y.o.  Admit date: 12/08/2023 Referring Physician Dr Calvin Robson Primary Physician Dr. Alm Na primary Primary Cardiologist  Reason for Consultation possible pulseless episode  HPI: 78 year old female history of GERD hypothyroidism presented with hypotension possible sepsis had an episode of altered mental status was unarousable and potentially were unable to get a pulse patient returned to p.o. spontaneously cardiology was consulted for further assessment evaluation with that episode patient denies previous cardiac history EKG was unremarkable  Review of systems complete and found to be negative unless listed above     Past Medical History:  Diagnosis Date   Acid reflux    Hypothyroidism     Past Surgical History:  Procedure Laterality Date   COLONOSCOPY WITH PROPOFOL  N/A 09/27/2017   Procedure: COLONOSCOPY WITH PROPOFOL ;  Surgeon: Toledo, Ladell POUR, MD;  Location: [facility] ENDOSCOPY;  Service: Gastroenterology;  Laterality: N/A;   ESOPHAGOGASTRODUODENOSCOPY (EGD) WITH PROPOFOL  N/A 09/27/2017   Procedure: ESOPHAGOGASTRODUODENOSCOPY (EGD) WITH PROPOFOL ;  Surgeon: Toledo, Ladell POUR, MD;  Location: [facility] ENDOSCOPY;  Service: Gastroenterology;  Laterality: N/A;   TUBAL LIGATION      (Not in a hospital admission)  Social History   Socioeconomic History   Marital status: Married    Spouse name: Not on file   Number of children: Not on file   Years of education: Not on file   Highest education level: Not on file  Occupational History   Not on file  Tobacco Use   Smoking status: Never   Smokeless tobacco: Never  Vaping Use   Vaping status: Never Used  Substance and Sexual Activity   Alcohol use: No   Drug use: No   Sexual activity: Not on file  Other Topics Concern   Not on file  Social History Narrative   Not on file   Social Drivers of Health    Financial Resource Strain: Low Risk  (10/02/2023)   Received from [facility]   Overall Financial Resource Strain (CARDIA)    Difficulty of Paying Living Expenses: Not hard at all  Food Insecurity: No Food Insecurity (10/02/2023)   Received from [facility]   Hunger Vital Sign    Worried About Running Out of Food in the Last Year: Never true    Ran Out of Food in the Last Year: Never true  Transportation Needs: No Transportation Needs (10/02/2023)   Received from [facility] - Transportation    In the past 12 months, has lack of transportation kept you from medical appointments or from getting medications?: No    Lack of Transportation (Non-Medical): No  Physical Activity: Not on file  Stress: Not on file  Social Connections: Not on file  Intimate Partner Violence: Not on file    History reviewed. No pertinent family history.    Review of systems complete and found to be negative unless listed above      PHYSICAL EXAM  General: Well developed, well nourished, in no acute distress HEENT:  Normocephalic and atramatic Neck:  No JVD.  Lungs: Clear bilaterally to auscultation and percussion. Heart: HRRR . Normal S1 and S2 without gallops or murmurs.  Abdomen: Bowel sounds are positive, abdomen soft and non-tender  Msk:  Back normal, normal gait. Normal strength and tone for age. Extremities: No clubbing, cyanosis or edema.  Neuro: Alert and oriented X 3. Psych:  Good affect, responds appropriately  Labs:   Lab Results  Component Value Date   WBC 6.4 12/09/2023   HGB 14.4 12/09/2023   HCT 41.5 12/09/2023   MCV 85.4 12/09/2023   PLT 204 12/09/2023    Recent Labs  Lab 12/08/23 1704 12/09/23 0638  NA 134* 137  K 3.2* 3.2*  CL 94* 98  CO2 18* 22  BUN 26* 23  CREATININE 0.66 0.71  CALCIUM 9.4 8.6*  PROT 7.0  --   BILITOT 2.2*  --   ALKPHOS 76  --   ALT 13  --   AST 19  --   GLUCOSE 102* 130*    Lab Results  Component Value Date   CKTOTAL 17 (L) 12/08/2023   TROPONINI <0.03 06/21/2017   No results found for: CHOL No results found for: HDL No results found for: LDLCALC No results found for: TRIG No results found for: CHOLHDL No results found for: LDLDIRECT    Radiology: CT CHEST ABDOMEN PELVIS W CONTRAST Result Date: 12/08/2023 CLINICAL DATA:  Sepsis weakness EXAM: CT CHEST, ABDOMEN, AND PELVIS WITH CONTRAST TECHNIQUE: Multidetector CT imaging of the chest, abdomen and pelvis was performed following the standard protocol during bolus administration of intravenous contrast. RADIATION DOSE REDUCTION: This exam was performed according to the departmental dose-optimization program which includes automated exposure control, adjustment of the mA and/or kV according to patient size and/or use of iterative reconstruction technique. CONTRAST:  OMNIPAQUE  IOHEXOL  300 MG/ML  SOLN COMPARISON:  Chest x-ray 12/08/2023, CT chest 04/08/2017 FINDINGS: CT CHEST FINDINGS Cardiovascular: Mild aortic atherosclerosis. No aneurysm. Normal cardiac size. No pericardial effusion Mediastinum/Nodes: Patent trachea. No thyroid mass. No suspicious lymph nodes. Lungs/Pleura: No acute airspace disease or effusion. Stable scattered pulmonary nodules, stable, no imaging follow-up is recommended. Musculoskeletal: No acute osseous abnormality CT ABDOMEN PELVIS FINDINGS Hepatobiliary: Cyst in the left hepatic lobe. No calcified gallstone. Dilated common bile duct measuring up to 11 mm. No intra hepatic biliary dilatation. Pancreas: Unremarkable. No pancreatic ductal dilatation or surrounding inflammatory changes. Spleen: Normal in size without focal abnormality. Adrenals/Urinary Tract: Adrenal glands are normal. Kidneys show no hydronephrosis. The bladder is unremarkable Stomach/Bowel: The stomach is nonenlarged. There is no dilated small bowel. No acute bowel wall thickening. Moderate impacted feces at the  rectum Vascular/Lymphatic: Moderate aortic atherosclerosis. No aneurysm. No suspicious lymph nodes Reproductive: Uterus demonstrates a large fundal fibroid measuring 10 cm. No adnexal mass. Other: Negative for pelvic effusion or free air. Musculoskeletal: No acute or suspicious osseous abnormality IMPRESSION: 1. No CT evidence for acute intrathoracic abnormality. 2. Dilated common bile duct measuring up to 11 mm. Correlate with LFTs with follow-up MRCP as indicated. 3. Moderate impacted feces at the rectum. 4. Large uterine fundal fibroid measuring 10 cm. 5. Aortic atherosclerosis. Aortic Atherosclerosis (ICD10-I70.0). Electronically Signed   By: Luke Bun M.D.   On: 12/08/2023 21:16   CT Head Wo Contrast Result Date: 12/08/2023 CLINICAL DATA:  Weakness.  Poor intake.  Syncope/presyncope EXAM: CT HEAD WITHOUT CONTRAST TECHNIQUE: Contiguous axial images were obtained from the base of the skull through the vertex without intravenous contrast. RADIATION DOSE REDUCTION: This exam was performed according to the departmental dose-optimization program which includes automated exposure control, adjustment of the mA and/or kV according to patient size and/or use of iterative reconstruction technique. COMPARISON:  CT and MRI head 05/05/2017 FINDINGS: Brain: No intracranial hemorrhage, mass effect, or evidence of acute infarct. No hydrocephalus. No extra-axial fluid  collection. Age-commensurate cerebral atrophy and chronic small vessel ischemic disease chronic lacunar infarcts both basal ganglia. Vascular: No hyperdense vessel. Intracranial arterial calcification. Skull: No fracture or focal lesion. Sinuses/Orbits: No acute finding. Other: None. IMPRESSION: No acute intracranial abnormality. Electronically Signed   By: Norman Gatlin M.D.   On: 12/08/2023 19:27   DG Chest Port 1 View Result Date: 12/08/2023 CLINICAL DATA:  Possible sepsis EXAM: PORTABLE CHEST 1 VIEW COMPARISON:  09/21/17 FINDINGS: The heart size and  mediastinal contours are within normal limits. Both lungs are clear. The visualized skeletal structures are unremarkable. IMPRESSION: No active disease. Electronically Signed   By: Oneil Devonshire M.D.   On: 12/08/2023 19:11    EKG: Normal sinus rhythm rate of 90 nonspecific STT-W changes  ASSESSMENT AND PLAN:  Sepsis with hypothermia Possible pulseless episode Dysphagia Hypokalemia Hypomagnesemia Metabolic acidosis Fecal impaction . Plan Agree with admit with supportive care Spectrum antibiotic therapy hydration hypothermia therapy I am not concerned about the process episode I think that was mostly dehydration and not primarily a cardiac event no direct cardiac modification intervention necessary Consider echocardiogram for left ventricular function wall motion Recommend functional study no invasive cardiac procedures at this stage Continue to follow conservatively   Signed: Cara JONETTA Lovelace MD 12/09/2023, 11:54 AM

## 2023-12-09 NOTE — Progress Notes (Signed)
 Pharmacy Antibiotic Note  Marilyn Castillo is a 78 y.o. female admitted on 12/08/2023 with sepsis.  Pharmacy has been consulted for Vancomycin  dosing.  Plan: Vancomycin  1 gm IV X 1 given in ED on 1/3 @ 1854. Additional Vanc 750 mg IV X 1 to make total loading dose of 1750 mg.   Vancomycin  1250 mg IV X Q24H ordered to start 1/4 @ 1900.  AUC = 482.2 Vanc trough = 11.1   Weight: 71.4 kg (157 lb 6.5 oz)  Temp (24hrs), Avg:97.5 F (36.4 C), Min:96.6 F (35.9 C), Max:98.5 F (36.9 C)  Recent Labs  Lab 12/08/23 1704 12/08/23 1708 12/08/23 1942  WBC 6.1  --   --   CREATININE 0.66  --   --   LATICACIDVEN  --  2.8* 2.2*    CrCl cannot be calculated (Unknown ideal weight.).    Allergies  Allergen Reactions   Penicillins Anaphylaxis    Has patient had a PCN reaction causing immediate rash, facial/tongue/throat swelling, SOB or lightheadedness with hypotension: Yes Has patient had a PCN reaction causing severe rash involving mucus membranes or skin necrosis: Unknown Has patient had a PCN reaction that required hospitalization: Unknown Has patient had a PCN reaction occurring within the last 10 years: Unknown If all of the above answers are NO, then may proceed with Cephalosporin use.     Antimicrobials this admission:   >>    >>   Dose adjustments this admission:   Microbiology results:  BCx:   UCx:    Sputum:    MRSA PCR:   Thank you for allowing pharmacy to be a part of this patient's care.  Enzo Treu D 12/09/2023 2:44 AM

## 2023-12-09 NOTE — H&P (Addendum)
 PCP:   Glover Lenis, MD   Chief Complaint:  Inability to eat  HPI: This is a 78 year old female with past medical history significant for hypothyroidism and GERD.  Per patient she has had difficulty swallowing for the last couple of months.  She has difficulty swallowing both liquids and solids.  She has seen her PCP but she is not been to see a gastroenterologist.  For the last week she has been drinking mainly water.  She takes no Ensure.  She reports weight loss and getting progressively weaker.  She denies nausea, vomiting, diarrhea constipation.  She denies chest pains.  She states her husband made her come to the ER  While being triaged patient became unresponsive.  No pulse was found patient's blood pressure was 36/24.  Patient did not respond to sternal rub.  She was seen in room 16 where she had spontaneous recovery. Vitals pulse pulseless episode 111/82, 108, 22, hypothermic at 97.4. Potassium 3.2, sodium 134, chloride 94, CO2 18, glucose 102, magnesium  1.6.  Lactic acid 2.8 => 2.2.  Troponin 7 => 6, hemoglobin 17.8.  Respiratory panel negative  CT chest/abdomen/pelvis: No CT evidence for acute intrathoracic abnormality. 2. Dilated common bile duct measuring up to 11 mm. Correlate with LFTs with follow-up MRCP as indicated. 3. Moderate impacted feces at the rectum.  Blood cultures x 2 collected.  Briar hugger initiated.  Patient given aztreonam , Flagyl  and vancomycin .  Sepsis fluid bolus.  Review of Systems:  Per HPI  Past Medical History: Past Medical History:  Diagnosis Date   Acid reflux    Hypothyroidism    Past Surgical History:  Procedure Laterality Date   COLONOSCOPY WITH PROPOFOL  N/A 09/27/2017   Procedure: COLONOSCOPY WITH PROPOFOL ;  Surgeon: Toledo, Ladell POUR, MD;  Location: [facility] ENDOSCOPY;  Service: Gastroenterology;  Laterality: N/A;   ESOPHAGOGASTRODUODENOSCOPY (EGD) WITH PROPOFOL  N/A 09/27/2017   Procedure: ESOPHAGOGASTRODUODENOSCOPY (EGD) WITH PROPOFOL ;   Surgeon: Toledo, Ladell POUR, MD;  Location: [facility] ENDOSCOPY;  Service: Gastroenterology;  Laterality: N/A;   TUBAL LIGATION      Medications: Prior to Admission medications   Medication Sig Start Date End Date Taking? Authorizing Provider  pantoprazole  (PROTONIX ) 40 MG tablet Take 1 tablet (40 mg total) by mouth daily. Patient not taking: Reported on 09/27/2017 04/08/17 04/08/18  Dorothyann Drivers, MD    Allergies:   Allergies  Allergen Reactions   Penicillins Anaphylaxis    Has patient had a PCN reaction causing immediate rash, facial/tongue/throat swelling, SOB or lightheadedness with hypotension: Yes Has patient had a PCN reaction causing severe rash involving mucus membranes or skin necrosis: Unknown Has patient had a PCN reaction that required hospitalization: Unknown Has patient had a PCN reaction occurring within the last 10 years: Unknown If all of the above answers are NO, then may proceed with Cephalosporin use.     Social History:  reports that she has never smoked. She has never used smokeless tobacco. She reports that she does not drink alcohol and does not use drugs.  Family History: History reviewed. No pertinent family history.  Physical Exam: Vitals:   12/08/23 2100 12/08/23 2130 12/08/23 2200 12/08/23 2230  BP: 119/76 116/68 116/70 117/79  Pulse: 87 90 91 91  Resp: (!) 9 12 13 13   Temp: (S) (!) 97.5 F (36.4 C)     TempSrc: Rectal     SpO2: 97% 98% 95% 95%  Weight:        General: A and O x 3, well developed and nourished, no  acute distress Eyes: Pink conjunctiva, no scleral icterus ENT: Moist oral mucosa, neck supple, no thyromegaly Lungs: CTA B/L, no wheeze, no crackles, no use of accessory muscles Cardiovascular: RRR, no regurgitation, no gallops, no murmurs. No carotid bruits, no JVD Abdomen: soft, positive BS, NTND, no organomegaly, not an acute abdomen GU: not examined Neuro: CN II - XII grossly intact, sensation intact Musculoskeletal:  strength 5/5 all extremities, no clubbing, cyanosis or edema Skin: Dry skin, no decubitus Psych: Flat, depressed affect   Labs on Admission:  Recent Labs    12/08/23 1704  NA 134*  K 3.2*  CL 94*  CO2 18*  GLUCOSE 102*  BUN 26*  CREATININE 0.66  CALCIUM 9.4   Recent Labs    12/08/23 1704  AST 19  ALT 13  ALKPHOS 76  BILITOT 2.2*  PROT 7.0  ALBUMIN 3.9    Recent Labs    12/08/23 1704  WBC 6.1  NEUTROABS 3.5  HGB 17.8*  HCT 51.8*  MCV 86.5  PLT 245   Recent Labs    12/08/23 1704  CKTOTAL 17*     Micro Results: Recent Results (from the past 240 hours)  Resp panel by RT-PCR (RSV, Flu A&B, Covid) Anterior Nasal Swab     Status: None   Collection Time: 12/08/23  5:17 PM   Specimen: Anterior Nasal Swab  Result Value Ref Range Status   SARS Coronavirus 2 by RT PCR NEGATIVE NEGATIVE Final    Comment: (NOTE) SARS-CoV-2 target nucleic acids are NOT DETECTED.  The SARS-CoV-2 RNA is generally detectable in upper respiratory specimens during the acute phase of infection. The lowest concentration of SARS-CoV-2 viral copies this assay can detect is 138 copies/mL. A negative result does not preclude SARS-Cov-2 infection and should not be used as the sole basis for treatment or other patient management decisions. A negative result may occur with  improper specimen collection/handling, submission of specimen other than nasopharyngeal swab, presence of viral mutation(s) within the areas targeted by this assay, and inadequate number of viral copies(<138 copies/mL). A negative result must be combined with clinical observations, patient history, and epidemiological information. The expected result is Negative.  Fact Sheet for Patients:  bloggercourse.com  Fact Sheet for Healthcare Providers:  seriousbroker.it  This test is no t yet approved or cleared by the [place]  FDA and  has been authorized for detection  and/or diagnosis of SARS-CoV-2 by FDA under an Emergency Use Authorization (EUA). This EUA will remain  in effect (meaning this test can be used) for the duration of the COVID-19 declaration under Section 564(b)(1) of the Act, 21 U.S.C.section 360bbb-3(b)(1), unless the authorization is terminated  or revoked sooner.       Influenza A by PCR NEGATIVE NEGATIVE Final   Influenza B by PCR NEGATIVE NEGATIVE Final    Comment: (NOTE) The Xpert Xpress SARS-CoV-2/FLU/RSV plus assay is intended as an aid in the diagnosis of influenza from Nasopharyngeal swab specimens and should not be used as a sole basis for treatment. Nasal washings and aspirates are unacceptable for Xpert Xpress SARS-CoV-2/FLU/RSV testing.  Fact Sheet for Patients: bloggercourse.com  Fact Sheet for Healthcare Providers: seriousbroker.it  This test is not yet approved or cleared by the [place]  FDA and has been authorized for detection and/or diagnosis of SARS-CoV-2 by FDA under an Emergency Use Authorization (EUA). This EUA will remain in effect (meaning this test can be used) for the duration of the COVID-19 declaration under Section 564(b)(1) of the Act, 21 U.S.C.  section 360bbb-3(b)(1), unless the authorization is terminated or revoked.     Resp Syncytial Virus by PCR NEGATIVE NEGATIVE Final    Comment: (NOTE) Fact Sheet for Patients: bloggercourse.com  Fact Sheet for Healthcare Providers: seriousbroker.it  This test is not yet approved or cleared by the [place]  FDA and has been authorized for detection and/or diagnosis of SARS-CoV-2 by FDA under an Emergency Use Authorization (EUA). This EUA will remain in effect (meaning this test can be used) for the duration of the COVID-19 declaration under Section 564(b)(1) of the Act, 21 U.S.C. section 360bbb-3(b)(1), unless the authorization is terminated  or revoked.  Performed at [facility], [address] [place], [place] [address]      Radiological Exams on Admission: CT CHEST ABDOMEN PELVIS W CONTRAST Result Date: 12/08/2023 CLINICAL DATA:  Sepsis weakness EXAM: CT CHEST, ABDOMEN, AND PELVIS WITH CONTRAST TECHNIQUE: Multidetector CT imaging of the chest, abdomen and pelvis was performed following the standard protocol during bolus administration of intravenous contrast. RADIATION DOSE REDUCTION: This exam was performed according to the departmental dose-optimization program which includes automated exposure control, adjustment of the mA and/or kV according to patient size and/or use of iterative reconstruction technique. CONTRAST:  OMNIPAQUE  IOHEXOL  300 MG/ML  SOLN COMPARISON:  Chest x-ray 12/08/2023, CT chest 04/08/2017 FINDINGS: CT CHEST FINDINGS Cardiovascular: Mild aortic atherosclerosis. No aneurysm. Normal cardiac size. No pericardial effusion Mediastinum/Nodes: Patent trachea. No thyroid mass. No suspicious lymph nodes. Lungs/Pleura: No acute airspace disease or effusion. Stable scattered pulmonary nodules, stable, no imaging follow-up is recommended. Musculoskeletal: No acute osseous abnormality CT ABDOMEN PELVIS FINDINGS Hepatobiliary: Cyst in the left hepatic lobe. No calcified gallstone. Dilated common bile duct measuring up to 11 mm. No intra hepatic biliary dilatation. Pancreas: Unremarkable. No pancreatic ductal dilatation or surrounding inflammatory changes. Spleen: Normal in size without focal abnormality. Adrenals/Urinary Tract: Adrenal glands are normal. Kidneys show no hydronephrosis. The bladder is unremarkable Stomach/Bowel: The stomach is nonenlarged. There is no dilated small bowel. No acute bowel wall thickening. Moderate impacted feces at the rectum Vascular/Lymphatic: Moderate aortic atherosclerosis. No aneurysm. No suspicious lymph nodes Reproductive: Uterus demonstrates a large fundal fibroid measuring 10  cm. No adnexal mass. Other: Negative for pelvic effusion or free air. Musculoskeletal: No acute or suspicious osseous abnormality IMPRESSION: 1. No CT evidence for acute intrathoracic abnormality. 2. Dilated common bile duct measuring up to 11 mm. Correlate with LFTs with follow-up MRCP as indicated. 3. Moderate impacted feces at the rectum. 4. Large uterine fundal fibroid measuring 10 cm. 5. Aortic atherosclerosis. Aortic Atherosclerosis (ICD10-I70.0). Electronically Signed   By: Luke Bun M.D.   On: 12/08/2023 21:16   CT Head Wo Contrast Result Date: 12/08/2023 CLINICAL DATA:  Weakness.  Poor intake.  Syncope/presyncope EXAM: CT HEAD WITHOUT CONTRAST TECHNIQUE: Contiguous axial images were obtained from the base of the skull through the vertex without intravenous contrast. RADIATION DOSE REDUCTION: This exam was performed according to the departmental dose-optimization program which includes automated exposure control, adjustment of the mA and/or kV according to patient size and/or use of iterative reconstruction technique. COMPARISON:  CT and MRI head 05/05/2017 FINDINGS: Brain: No intracranial hemorrhage, mass effect, or evidence of acute infarct. No hydrocephalus. No extra-axial fluid collection. Age-commensurate cerebral atrophy and chronic small vessel ischemic disease chronic lacunar infarcts both basal ganglia. Vascular: No hyperdense vessel. Intracranial arterial calcification. Skull: No fracture or focal lesion. Sinuses/Orbits: No acute finding. Other: None. IMPRESSION: No acute intracranial abnormality. Electronically Signed   By: Norman  Stutzman M.D.   On: 12/08/2023 19:27   DG Chest Port 1 View Result Date: 12/08/2023 CLINICAL DATA:  Possible sepsis EXAM: PORTABLE CHEST 1 VIEW COMPARISON:  09/21/17 FINDINGS: The heart size and mediastinal contours are within normal limits. Both lungs are clear. The visualized skeletal structures are unremarkable. IMPRESSION: No active disease. Electronically  Signed   By: Oneil Devonshire M.D.   On: 12/08/2023 19:11    Assessment/Plan Present on Admission: Sepsis with hypothermia // lactic acidosis //hypothermia -Sepsis bolus given.  Blood cultures x 2 collected.  Continue IV antibiotics until cultures resulted.  Continue Bair hugger until patient normothermic. -This may be less sepsis and more due to poor nutrition/starvation, vasovagal -Continue IV fluid hydration  Brief pulseless episode -Doubt primary cardiac etiology.  2D echo ordered.  Cardiology consult placed for clearance by epic, Dr. Florencio  Dysphagia -Ensure twice daily.  GI consult in a.m. for possible EGD  Hypokalemia // Hypomagnesemia// Metabolic acidosis -Diet relation to poor p.o. intake.  Encouraging IV.  Repeat labs in a.m.  Fecal impaction -Bowel regimen started  Zane Samson 12/09/2023, 12:14 AM

## 2023-12-09 NOTE — Progress Notes (Signed)
 Brief hospitalist update note.  This is a nonbillable note.  Please see same-day H&P for full billable details.  Briefly, this is a 78 year old female who does not follow regularly with doctors who apparently had difficulty swallowing for the past couple months.  This has been worsening in severity to the point that now she is only drinking water.  Not able to tolerate it does not consume any protein supplements.  Reports weight loss and progressive weakness.  Denies nausea vomiting or diarrhea.  Does endorse constipation.  Denies chest pain.  Was brought to the ER at the urging of her husband.  Apparently patient has not been following up with outpatient physicians and has not been seen by gastroenterology in the past.  While in triage patient suddenly became unresponsive.  No pulse was found.  Blood pressure apparently 36/24.  Not responsive to noxious stimulus.  Patient did have spontaneous recovery.  Did not require endotracheal intubation no chest compressions.  Etiology of dysphagia is unclear.  Patient is a poor historian.  I will engage consultation from SLP to evaluate for oropharyngeal dysphagia.  If this is unrevealing we will consider GI consultation for endoluminal evaluation  Calvin Robson MD  No charge

## 2023-12-10 ENCOUNTER — Inpatient Hospital Stay
Admit: 2023-12-10 | Discharge: 2023-12-10 | Disposition: A | Discharge status: Home / Self Care | Payer: Medicare Other | Attending: Family Medicine | Admitting: Family Medicine

## 2023-12-10 DIAGNOSIS — R627 Adult failure to thrive: Secondary | ICD-10-CM

## 2023-12-10 LAB — URINE CULTURE: Culture: NO GROWTH

## 2023-12-10 LAB — ECHOCARDIOGRAM COMPLETE
AR max vel: 2.58 cm2
AV Peak grad: 9.5 mm[Hg]
Ao pk vel: 1.54 m/s
Area-P 1/2: 3.24 cm2
Height: 67 in
S' Lateral: 3 cm
Weight: 2518.54 [oz_av]

## 2023-12-10 LAB — PROCALCITONIN: Procalcitonin: <0.1 ng/mL

## 2023-12-10 MED ORDER — PANTOPRAZOLE SODIUM 40 MG PO TBEC
40.0000 mg | DELAYED_RELEASE_TABLET | Freq: Every day | ORAL | Status: DC
  Administered 2023-12-10 – 2023-12-11 (×2): 40 mg via ORAL
  Filled 2023-12-10 (×2): qty 1

## 2023-12-10 MED ORDER — MIRTAZAPINE 15 MG PO TABS
15.0000 mg | ORAL_TABLET | Freq: Every day | ORAL | Status: DC
  Administered 2023-12-10 – 2023-12-13 (×4): 15 mg via ORAL
  Filled 2023-12-10 (×4): qty 1

## 2023-12-10 MED ORDER — POTASSIUM CHLORIDE IN NACL 20-0.9 MEQ/L-% IV SOLN
INTRAVENOUS | Status: AC
  Filled 2023-12-10 (×2): qty 1000

## 2023-12-10 NOTE — Evaluation (Signed)
 Physical Therapy Evaluation Patient Details Name: Marilyn Castillo MRN: [id number] DOB: 17-Jul-1946 Today's Date: 12/10/2023  History of Present Illness  presented to ER secondary to persistent inability to eat with noted hypotension, syncopal episode upon arrival; admitted for management of sepsis vs vasovagal episode.  Clinical Impression  Patient resting in bed upon arrival to room; husband at bedside (though stepped out during session). Patient alert and oriented to basic information, follows simple commands; intermittent delay in processing/initiation and limited insight into deficits/full situation.  Denies pain. Generally weak and deconditioned throughout all extremities; no focal weakness appreciated.  Able to complete bed mobility with min assist; sit/stand, standing balance and lateral stepping edge of bed with R HHA, min/mod assist.  Requires manual faciliation from therapist to unweight/advance LEs; generally weak and unsteady, poor balance reactions in all planes. Does deny dizziness/lightheadedness with transition to upright; will benefit from trial of RW with progressive gait efforts    Would benefit from skilled PT to address above deficits and promote optimal return to PLOF.; recommend post-acute PT follow up as indicated by interdisciplinary care team.          If plan is discharge home, recommend the following: A lot of help with walking and/or transfers;A lot of help with bathing/dressing/bathroom   Can travel by private vehicle   Yes    Equipment Recommendations    Recommendations for Other Services       Functional Status Assessment Patient has had a recent decline in their functional status and demonstrates the ability to make significant improvements in function in a reasonable and predictable amount of time.     Precautions / Restrictions Precautions Precautions: Fall Restrictions Weight Bearing Restrictions Per Provider Order: No      Mobility  Bed  Mobility Overal bed mobility: Needs Assistance Bed Mobility: Supine to Sit     Supine to sit: Min assist          Transfers Overall transfer level: Needs assistance Equipment used: 1 person hand held assist Transfers: Sit to/from Stand Sit to Stand: Min assist, Mod assist                Ambulation/Gait Ambulation/Gait assistance: Min assist, Mod assist Gait Distance (Feet): 4 Feet Assistive device: 1 person hand held assist         General Gait Details: lateral stepping at edge of bed, R HHA for balance/support; requires manual faciliation from therapist to unweight/advance LEs; generally weak and unsteady, poor balance reactions in all planes.  Does deny dizziness/lightheadedness with transition to upright; will benefit from trial of RW with progressive gait efforts  Stairs            Wheelchair Mobility     Tilt Bed    Modified Rankin (Stroke Patients Only)       Balance Overall balance assessment: Needs assistance Sitting-balance support: No upper extremity supported, Feet supported Sitting balance-Leahy Scale: Fair     Standing balance support: Single extremity supported Standing balance-Leahy Scale: Poor                               Pertinent Vitals/Pain Pain Assessment Pain Assessment: No/denies pain    Home Living Family/patient expects to be discharged to:: Private residence Living Arrangements: Spouse/significant other Available Help at Discharge: Family Type of Home: House         Home Layout: Two level;Bed/bath upstairs        Prior Function  Mobility Comments: Per patient, has not been out of bed in several months to a year due to progressive weakness (and inability to eat)       Extremity/Trunk Assessment   Upper Extremity Assessment Upper Extremity Assessment: Generalized weakness (grossly 4/5 throughout)    Lower Extremity Assessment Lower Extremity Assessment: Generalized  weakness (grossly at least 3/5 throughout)       Communication   Communication Communication: Hearing impairment  Cognition Arousal: Alert Behavior During Therapy: Flat affect Overall Cognitive Status: Difficult to assess                                 General Comments: Oriented to self, location and situation; follows simple commands, but does require increased time for processing/initiation at times        General Comments      Exercises     Assessment/Plan    PT Assessment Patient needs continued PT services  PT Problem List Decreased strength;Decreased activity tolerance;Decreased balance;Decreased mobility;Decreased cognition;Decreased knowledge of use of DME;Decreased safety awareness;Decreased knowledge of precautions       PT Treatment Interventions DME instruction;Gait training;Stair training;Functional mobility training;Therapeutic activities;Therapeutic exercise;Balance training;Cognitive remediation;Patient/family education    PT Goals (Current goals can be found in the Care Plan section)  Acute Rehab PT Goals Patient Stated Goal: to return home PT Goal Formulation: With patient Time For Goal Achievement: 12/24/23 Potential to Achieve Goals: Good    Frequency Min 1X/week     Co-evaluation               AM-PAC PT 6 Clicks Mobility  Outcome Measure Help needed turning from your back to your side while in a flat bed without using bedrails?: A Little Help needed moving from lying on your back to sitting on the side of a flat bed without using bedrails?: A Little Help needed moving to and from a bed to a chair (including a wheelchair)?: A Little Help needed standing up from a chair using your arms (e.g., wheelchair or bedside chair)?: A Lot Help needed to walk in hospital room?: A Lot Help needed climbing 3-5 steps with a railing? : A Lot 6 Click Score: 15    End of Session   Activity Tolerance: Patient tolerated treatment  well;Patient limited by fatigue Patient left: in bed;with call bell/phone within reach;with family/visitor present Nurse Communication: Mobility status PT Visit Diagnosis: Muscle weakness (generalized) (M62.81);Difficulty in walking, not elsewhere classified (R26.2)    Time: 8950-8892 PT Time Calculation (min) (ACUTE ONLY): 18 min   Charges:   PT Evaluation $PT Eval Moderate Complexity: 1 Mod   PT General Charges $$ ACUTE PT VISIT: 1 Visit        Arrington Bencomo H. Delores, PT, DPT, NCS 12/10/23, 2:33 PM [phone]

## 2023-12-10 NOTE — TOC CM/SW Note (Signed)
 Transition of Care [facility]) - CAGE-AID Screening   Patient Details  Name: Marilyn Castillo MRN: [id number] Date of Birth: Jan 25, 1946  Transition of Care [facility]) CM/SW Contact:    Leita CHRISTELLA Carlota Liane, LCSWA Phone Number: 12/10/2023, 4:07 PM   Clinical Narrative:  CSW left message for spouse for PT reccs.  CAGE-AID Screening:

## 2023-12-10 NOTE — Progress Notes (Signed)
  Echocardiogram 2D Echocardiogram has been performed.  Marilyn Castillo 12/10/2023, 4:03 PM

## 2023-12-10 NOTE — ED Notes (Signed)
 RN offered toileting and a bath to pt, but pt refused at this time. She states she is comfortable and denies needs.

## 2023-12-10 NOTE — Progress Notes (Signed)
 PROGRESS NOTE    Ceriah Castillo  FMW:[id number] DOB: July 04, 1946 DOA: 12/08/2023 PCP: Marilyn Lenis, MD    Brief Narrative:  78 year old female who does not follow regularly with doctors who apparently had difficulty swallowing for the past couple months.  This has been worsening in severity to the point that now she is only drinking water.  Not able to tolerate it does not consume any protein supplements.  Reports weight loss and progressive weakness.  Denies nausea vomiting or diarrhea.  Does endorse constipation.  Denies chest pain.  Was brought to the ER at the urging of her husband.  Apparently patient has not been following up with outpatient physicians and has not been seen by gastroenterology in the past.   While in triage patient suddenly became unresponsive.  No pulse was found.  Blood pressure apparently 36/24.  Not responsive to noxious stimulus.  Patient did have spontaneous recovery.  Did not require endotracheal intubation no chest compressions.   Etiology of dysphagia is unclear.  Patient is a poor historian.  I will engage consultation from SLP to evaluate for oropharyngeal dysphagia.  If this is unrevealing we will consider GI consultation for endoluminal evaluation  1/5: Seen by SLP.  No evidence of oropharyngeal dysphagia.  Diet advanced to regular with thin liquids.  On 1/5 AM I noted the patient to be eating and swallowing without issue with husband at bedside.   Assessment & Plan:   Principal Problem:   Sepsis (HCC) Active Problems:   Episode of unresponsiveness   Hypokalemia   Lactic acidosis   Metabolic acidosis   Fecal impaction (HCC)   Adult failure to thrive  Adult failure to thrive Patient presented with suppose it inability to eat since October.  Was initially doing Ensure shakes but deteriorated to where she was only consuming water.  Do not suspect this to be anatomic in nature.  Patient was cleared by SLP for regular diet with thin liquids.  No signs  or symptoms of oropharyngeal dysphagia.  My suspicion for esophageal dysphagia is low.  Patient seems to be eating and tolerating swallowing without issue. Plan: Regular diet as tolerated PPI RD consultation for dietary education  Hypothermia Lactic acidosis Brief episode of hypotension and unresponsiveness The episode associated with hypotension and unresponsiveness was noted in triage.  Supposedly systolic blood pressure in the 30s.  This resolved spontaneously without intervention.  Very low suspicion for true cardiac arrest.  Seen in consultation by cardiology.  No plans for ischemic evaluation. No evidence of sepsis.  Sepsis ruled out Plan: Stop antibiotics Monitor vitals and fever curve Fall precautions  Electrolyte derangements Monitor and replace as necessary  Fecal impaction/constipation Resolved Initiated bowel regimen   DVT prophylaxis: Lovenox  Code Status: Full Family Communication: Spouse at bedside 1/5 Disposition Plan: Status is: Inpatient Remains inpatient appropriate because: Adult failure to thrive   Level of care: Med-Surg  Consultants:  None  Procedures:  None  Antimicrobials: None   Subjective: Seen and examined.  Sitting in bed.  Seems to be eating without issue.  Communicating more freely today.  Husband at bedside.  Objective: Vitals:   12/10/23 0800 12/10/23 1000 12/10/23 1212 12/10/23 1424  BP: (!) 102/46 (!) 105/42  (!) 99/51  Pulse: (!) 58 (!) 54  (!) 57  Resp: (!) 9 15  14   Temp:   97.7 F (36.5 C) 97.7 F (36.5 C)  TempSrc:   Oral Oral  SpO2: 100% 95%  97%  Weight:  Height:        Intake/Output Summary (Last 24 hours) at 12/10/2023 1656 Last data filed at 12/10/2023 1214 Gross per 24 hour  Intake 2324.65 ml  Output 800 ml  Net 1524.65 ml   Filed Weights   12/08/23 1748  Weight: 71.4 kg    Examination:  General exam: NAD Respiratory system: Lungs clear.  Normal work of breathing.  Room air Cardiovascular system:  S1-S2, RRR, no murmurs, no pedal edema Gastrointestinal system: Soft, NT/ND, hyperactive bowel sounds Central nervous system: Alert and oriented. No focal neurological deficits. Extremities: Symmetric 5 x 5 power. Skin: No rashes, lesions or ulcers Psychiatry: Judgement and insight appear normal. Mood & affect flattened.     Data Reviewed: I have personally reviewed following labs and imaging studies  CBC: Recent Labs  Lab 12/08/23 1704 12/09/23 0638  WBC 6.1 6.4  NEUTROABS 3.5  --   HGB 17.8* 14.4  HCT 51.8* 41.5  MCV 86.5 85.4  PLT 245 204   Basic Metabolic Panel: Recent Labs  Lab 12/08/23 1704 12/08/23 1942 12/09/23 0638  NA 134*  --  137  K 3.2*  --  3.2*  CL 94*  --  98  CO2 18*  --  22  GLUCOSE 102*  --  130*  BUN 26*  --  23  CREATININE 0.66  --  0.71  CALCIUM 9.4  --  8.6*  MG  --  1.6* 2.4   GFR: Estimated Creatinine Clearance: 55.5 mL/min (by C-G formula based on SCr of 0.71 mg/dL). Liver Function Tests: Recent Labs  Lab 12/08/23 1704  AST 19  ALT 13  ALKPHOS 76  BILITOT 2.2*  PROT 7.0  ALBUMIN 3.9   No results for input(s): LIPASE, AMYLASE in the last 168 hours. No results for input(s): AMMONIA in the last 168 hours. Coagulation Profile: Recent Labs  Lab 12/08/23 1704  INR 1.2   Cardiac Enzymes: Recent Labs  Lab 12/08/23 1704  CKTOTAL 17*   BNP (last 3 results) No results for input(s): PROBNP in the last 8760 hours. HbA1C: No results for input(s): HGBA1C in the last 72 hours. CBG: No results for input(s): GLUCAP in the last 168 hours. Lipid Profile: No results for input(s): CHOL, HDL, LDLCALC, TRIG, CHOLHDL, LDLDIRECT in the last 72 hours. Thyroid Function Tests: No results for input(s): TSH, T4TOTAL, FREET4, T3FREE, THYROIDAB in the last 72 hours. Anemia Panel: No results for input(s): VITAMINB12, FOLATE, FERRITIN, TIBC, IRON, RETICCTPCT in the last 72 hours. Sepsis Labs: Recent  Labs  Lab 12/08/23 1708 12/08/23 1942 12/09/23 9361  PROCALCITON  --   --  <0.10  LATICACIDVEN 2.8* 2.2*  --     Recent Results (from the past 240 hours)  Resp panel by RT-PCR (RSV, Flu A&B, Covid) Anterior Nasal Swab     Status: None   Collection Time: 12/08/23  5:17 PM   Specimen: Anterior Nasal Swab  Result Value Ref Range Status   SARS Coronavirus 2 by RT PCR NEGATIVE NEGATIVE Final    Comment: (NOTE) SARS-CoV-2 target nucleic acids are NOT DETECTED.  The SARS-CoV-2 RNA is generally detectable in upper respiratory specimens during the acute phase of infection. The lowest concentration of SARS-CoV-2 viral copies this assay can detect is 138 copies/mL. A negative result does not preclude SARS-Cov-2 infection and should not be used as the sole basis for treatment or other patient management decisions. A negative result may occur with  improper specimen collection/handling, submission of specimen other than nasopharyngeal swab,  presence of viral mutation(s) within the areas targeted by this assay, and inadequate number of viral copies(<138 copies/mL). A negative result must be combined with clinical observations, patient history, and epidemiological information. The expected result is Negative.  Fact Sheet for Patients:  bloggercourse.com  Fact Sheet for Healthcare Providers:  seriousbroker.it  This test is no t yet approved or cleared by the [place]  FDA and  has been authorized for detection and/or diagnosis of SARS-CoV-2 by FDA under an Emergency Use Authorization (EUA). This EUA will remain  in effect (meaning this test can be used) for the duration of the COVID-19 declaration under Section 564(b)(1) of the Act, 21 U.S.C.section 360bbb-3(b)(1), unless the authorization is terminated  or revoked sooner.       Influenza A by PCR NEGATIVE NEGATIVE Final   Influenza B by PCR NEGATIVE NEGATIVE Final    Comment:  (NOTE) The Xpert Xpress SARS-CoV-2/FLU/RSV plus assay is intended as an aid in the diagnosis of influenza from Nasopharyngeal swab specimens and should not be used as a sole basis for treatment. Nasal washings and aspirates are unacceptable for Xpert Xpress SARS-CoV-2/FLU/RSV testing.  Fact Sheet for Patients: bloggercourse.com  Fact Sheet for Healthcare Providers: seriousbroker.it  This test is not yet approved or cleared by the [place]  FDA and has been authorized for detection and/or diagnosis of SARS-CoV-2 by FDA under an Emergency Use Authorization (EUA). This EUA will remain in effect (meaning this test can be used) for the duration of the COVID-19 declaration under Section 564(b)(1) of the Act, 21 U.S.C. section 360bbb-3(b)(1), unless the authorization is terminated or revoked.     Resp Syncytial Virus by PCR NEGATIVE NEGATIVE Final    Comment: (NOTE) Fact Sheet for Patients: bloggercourse.com  Fact Sheet for Healthcare Providers: seriousbroker.it  This test is not yet approved or cleared by the [place]  FDA and has been authorized for detection and/or diagnosis of SARS-CoV-2 by FDA under an Emergency Use Authorization (EUA). This EUA will remain in effect (meaning this test can be used) for the duration of the COVID-19 declaration under Section 564(b)(1) of the Act, 21 U.S.C. section 360bbb-3(b)(1), unless the authorization is terminated or revoked.  Performed at [facility], [address] [place], [place] [address]   Blood Culture (routine x 2)     Status: None (Preliminary result)   Collection Time: 12/08/23  5:17 PM   Specimen: BLOOD  Result Value Ref Range Status   Specimen Description BLOOD RIGHT ANTECUBITAL  Final   Special Requests   Final    BOTTLES DRAWN AEROBIC AND ANAEROBIC Blood Culture results may not be optimal due to an  inadequate volume of blood received in culture bottles   Culture   Final    NO GROWTH 2 DAYS Performed at [facility], [address], [place], [place] [address]    Report Status PENDING  Incomplete  Urine Culture     Status: None   Collection Time: 12/09/23  5:17 AM   Specimen: Urine, Random  Result Value Ref Range Status   Specimen Description   Final    URINE, RANDOM Performed at [facility], [address], [place], [place] [address]    Special Requests   Final    NONE Reflexed from [id number] Performed at [facility], [address], [place], [place] [address]    Culture   Final    NO GROWTH Performed at [facility], [address] [place], [place] [address]  Report Status 12/10/2023 FINAL  Final         Radiology Studies: CT CHEST ABDOMEN PELVIS W CONTRAST Result Date: 12/08/2023 CLINICAL DATA:  Sepsis weakness EXAM: CT CHEST, ABDOMEN, AND PELVIS WITH CONTRAST TECHNIQUE: Multidetector CT imaging of the chest, abdomen and pelvis was performed following the standard protocol during bolus administration of intravenous contrast. RADIATION DOSE REDUCTION: This exam was performed according to the departmental dose-optimization program which includes automated exposure control, adjustment of the mA and/or kV according to patient size and/or use of iterative reconstruction technique. CONTRAST:  OMNIPAQUE  IOHEXOL  300 MG/ML  SOLN COMPARISON:  Chest x-ray 12/08/2023, CT chest 04/08/2017 FINDINGS: CT CHEST FINDINGS Cardiovascular: Mild aortic atherosclerosis. No aneurysm. Normal cardiac size. No pericardial effusion Mediastinum/Nodes: Patent trachea. No thyroid mass. No suspicious lymph nodes. Lungs/Pleura: No acute airspace disease or effusion. Stable scattered pulmonary nodules, stable, no imaging follow-up is recommended. Musculoskeletal: No acute osseous abnormality CT ABDOMEN PELVIS FINDINGS Hepatobiliary: Cyst in the left hepatic lobe. No  calcified gallstone. Dilated common bile duct measuring up to 11 mm. No intra hepatic biliary dilatation. Pancreas: Unremarkable. No pancreatic ductal dilatation or surrounding inflammatory changes. Spleen: Normal in size without focal abnormality. Adrenals/Urinary Tract: Adrenal glands are normal. Kidneys show no hydronephrosis. The bladder is unremarkable Stomach/Bowel: The stomach is nonenlarged. There is no dilated small bowel. No acute bowel wall thickening. Moderate impacted feces at the rectum Vascular/Lymphatic: Moderate aortic atherosclerosis. No aneurysm. No suspicious lymph nodes Reproductive: Uterus demonstrates a large fundal fibroid measuring 10 cm. No adnexal mass. Other: Negative for pelvic effusion or free air. Musculoskeletal: No acute or suspicious osseous abnormality IMPRESSION: 1. No CT evidence for acute intrathoracic abnormality. 2. Dilated common bile duct measuring up to 11 mm. Correlate with LFTs with follow-up MRCP as indicated. 3. Moderate impacted feces at the rectum. 4. Large uterine fundal fibroid measuring 10 cm. 5. Aortic atherosclerosis. Aortic Atherosclerosis (ICD10-I70.0). Electronically Signed   By: Luke Bun M.D.   On: 12/08/2023 21:16   CT Head Wo Contrast Result Date: 12/08/2023 CLINICAL DATA:  Weakness.  Poor intake.  Syncope/presyncope EXAM: CT HEAD WITHOUT CONTRAST TECHNIQUE: Contiguous axial images were obtained from the base of the skull through the vertex without intravenous contrast. RADIATION DOSE REDUCTION: This exam was performed according to the departmental dose-optimization program which includes automated exposure control, adjustment of the mA and/or kV according to patient size and/or use of iterative reconstruction technique. COMPARISON:  CT and MRI head 05/05/2017 FINDINGS: Brain: No intracranial hemorrhage, mass effect, or evidence of acute infarct. No hydrocephalus. No extra-axial fluid collection. Age-commensurate cerebral atrophy and chronic small  vessel ischemic disease chronic lacunar infarcts both basal ganglia. Vascular: No hyperdense vessel. Intracranial arterial calcification. Skull: No fracture or focal lesion. Sinuses/Orbits: No acute finding. Other: None. IMPRESSION: No acute intracranial abnormality. Electronically Signed   By: Norman Gatlin M.D.   On: 12/08/2023 19:27   DG Chest Port 1 View Result Date: 12/08/2023 CLINICAL DATA:  Possible sepsis EXAM: PORTABLE CHEST 1 VIEW COMPARISON:  09/21/17 FINDINGS: The heart size and mediastinal contours are within normal limits. Both lungs are clear. The visualized skeletal structures are unremarkable. IMPRESSION: No active disease. Electronically Signed   By: Oneil Devonshire M.D.   On: 12/08/2023 19:11        Scheduled Meds:  docusate sodium   100 mg Oral BID   enoxaparin  (LOVENOX ) injection  40 mg Subcutaneous Q24H   feeding supplement  237 mL Oral BID BM   mirtazapine   15 mg  Oral QHS   pantoprazole   40 mg Oral Daily   polyethylene glycol  17 g Oral BID   Continuous Infusions:  0.9 % NaCl with KCl 20 mEq / L 75 mL/hr at 12/10/23 1214     LOS: 1 day    Calvin KATHEE Robson, MD [facility]   If 7PM-7AM, please contact night-coverage  12/10/2023, 4:56 PM

## 2023-12-10 NOTE — Progress Notes (Signed)
 [facility] Cardiology    SUBJECTIVE: Resting comfortably in bed complains of weakness difficulty swallowing poor appetite blood pressure is much improved slightly bradycardic no Baclospas or syncope   Vitals:   12/10/23 0600 12/10/23 0720 12/10/23 0800 12/10/23 1000  BP: (!) 110/42  (!) 102/46 (!) 105/42  Pulse: (!) 49  (!) 58 (!) 54  Resp: (!) 23  (!) 9 15  Temp:  98.1 F (36.7 C)    TempSrc:  Oral    SpO2: 97%  100% 95%  Weight:      Height:         Intake/Output Summary (Last 24 hours) at 12/10/2023 1207 Last data filed at 12/10/2023 9352 Gross per 24 hour  Intake 2136.13 ml  Output 800 ml  Net 1336.13 ml      PHYSICAL EXAM  General: Well developed, well nourished, in no acute distress HEENT:  Normocephalic and atramatic Neck:  No JVD.  Lungs: Clear bilaterally to auscultation and percussion. Heart: Bradycardic. Normal S1 and S2 without gallops or murmurs.  Abdomen: Bowel sounds are positive, abdomen soft and non-tender  Msk:  Back normal, normal gait. Normal strength and tone for age. Extremities: No clubbing, cyanosis or edema.   Neuro: Alert and oriented X 3. Psych:  Good affect, responds appropriately   LABS: Basic Metabolic Panel: Recent Labs    12/08/23 1704 12/08/23 1942 12/09/23 0638  NA 134*  --  137  K 3.2*  --  3.2*  CL 94*  --  98  CO2 18*  --  22  GLUCOSE 102*  --  130*  BUN 26*  --  23  CREATININE 0.66  --  0.71  CALCIUM 9.4  --  8.6*  MG  --  1.6* 2.4   Liver Function Tests: Recent Labs    12/08/23 1704  AST 19  ALT 13  ALKPHOS 76  BILITOT 2.2*  PROT 7.0  ALBUMIN 3.9   No results for input(s): LIPASE, AMYLASE in the last 72 hours. CBC: Recent Labs    12/08/23 1704 12/09/23 0638  WBC 6.1 6.4  NEUTROABS 3.5  --   HGB 17.8* 14.4  HCT 51.8* 41.5  MCV 86.5 85.4  PLT 245 204   Cardiac Enzymes: Recent Labs    12/08/23 1704  CKTOTAL 17*   BNP: Invalid input(s): POCBNP D-Dimer: No results for input(s): DDIMER in the  last 72 hours. Hemoglobin A1C: No results for input(s): HGBA1C in the last 72 hours. Fasting Lipid Panel: No results for input(s): CHOL, HDL, LDLCALC, TRIG, CHOLHDL, LDLDIRECT in the last 72 hours. Thyroid Function Tests: No results for input(s): TSH, T4TOTAL, T3FREE, THYROIDAB in the last 72 hours.  Invalid input(s): FREET3 Anemia Panel: No results for input(s): VITAMINB12, FOLATE, FERRITIN, TIBC, IRON, RETICCTPCT in the last 72 hours.  CT CHEST ABDOMEN PELVIS W CONTRAST Result Date: 12/08/2023 CLINICAL DATA:  Sepsis weakness EXAM: CT CHEST, ABDOMEN, AND PELVIS WITH CONTRAST TECHNIQUE: Multidetector CT imaging of the chest, abdomen and pelvis was performed following the standard protocol during bolus administration of intravenous contrast. RADIATION DOSE REDUCTION: This exam was performed according to the departmental dose-optimization program which includes automated exposure control, adjustment of the mA and/or kV according to patient size and/or use of iterative reconstruction technique. CONTRAST:  OMNIPAQUE  IOHEXOL  300 MG/ML  SOLN COMPARISON:  Chest x-ray 12/08/2023, CT chest 04/08/2017 FINDINGS: CT CHEST FINDINGS Cardiovascular: Mild aortic atherosclerosis. No aneurysm. Normal cardiac size. No pericardial effusion Mediastinum/Nodes: Patent trachea. No thyroid mass. No suspicious lymph  nodes. Lungs/Pleura: No acute airspace disease or effusion. Stable scattered pulmonary nodules, stable, no imaging follow-up is recommended. Musculoskeletal: No acute osseous abnormality CT ABDOMEN PELVIS FINDINGS Hepatobiliary: Cyst in the left hepatic lobe. No calcified gallstone. Dilated common bile duct measuring up to 11 mm. No intra hepatic biliary dilatation. Pancreas: Unremarkable. No pancreatic ductal dilatation or surrounding inflammatory changes. Spleen: Normal in size without focal abnormality. Adrenals/Urinary Tract: Adrenal glands are normal. Kidneys show no  hydronephrosis. The bladder is unremarkable Stomach/Bowel: The stomach is nonenlarged. There is no dilated small bowel. No acute bowel wall thickening. Moderate impacted feces at the rectum Vascular/Lymphatic: Moderate aortic atherosclerosis. No aneurysm. No suspicious lymph nodes Reproductive: Uterus demonstrates a large fundal fibroid measuring 10 cm. No adnexal mass. Other: Negative for pelvic effusion or free air. Musculoskeletal: No acute or suspicious osseous abnormality IMPRESSION: 1. No CT evidence for acute intrathoracic abnormality. 2. Dilated common bile duct measuring up to 11 mm. Correlate with LFTs with follow-up MRCP as indicated. 3. Moderate impacted feces at the rectum. 4. Large uterine fundal fibroid measuring 10 cm. 5. Aortic atherosclerosis. Aortic Atherosclerosis (ICD10-I70.0). Electronically Signed   By: Luke Bun M.D.   On: 12/08/2023 21:16   CT Head Wo Contrast Result Date: 12/08/2023 CLINICAL DATA:  Weakness.  Poor intake.  Syncope/presyncope EXAM: CT HEAD WITHOUT CONTRAST TECHNIQUE: Contiguous axial images were obtained from the base of the skull through the vertex without intravenous contrast. RADIATION DOSE REDUCTION: This exam was performed according to the departmental dose-optimization program which includes automated exposure control, adjustment of the mA and/or kV according to patient size and/or use of iterative reconstruction technique. COMPARISON:  CT and MRI head 05/05/2017 FINDINGS: Brain: No intracranial hemorrhage, mass effect, or evidence of acute infarct. No hydrocephalus. No extra-axial fluid collection. Age-commensurate cerebral atrophy and chronic small vessel ischemic disease chronic lacunar infarcts both basal ganglia. Vascular: No hyperdense vessel. Intracranial arterial calcification. Skull: No fracture or focal lesion. Sinuses/Orbits: No acute finding. Other: None. IMPRESSION: No acute intracranial abnormality. Electronically Signed   By: Norman Gatlin M.D.    On: 12/08/2023 19:27   DG Chest Port 1 View Result Date: 12/08/2023 CLINICAL DATA:  Possible sepsis EXAM: PORTABLE CHEST 1 VIEW COMPARISON:  09/21/17 FINDINGS: The heart size and mediastinal contours are within normal limits. Both lungs are clear. The visualized skeletal structures are unremarkable. IMPRESSION: No active disease. Electronically Signed   By: Oneil Devonshire M.D.   On: 12/08/2023 19:11     Echo ending  TELEMETRY: sinus bradycardia rhythm nonspecific ST-T wave changes:  ASSESSMENT AND PLAN:  Principal Problem:   Sepsis (HCC) Active Problems:   Episode of unresponsiveness   Hypokalemia   Lactic acidosis   Metabolic acidosis   Fecal impaction (HCC)    Plan Continue conservative cardiac input at this point Probably able to discontinue telemetry tomorrow Agree with echocardiogram for left ventricular function wall motion Patient no cardiac procedures planned or recommended at this point  Marilyn JONETTA Lovelace, MD 12/10/2023 12:07 PM

## 2023-12-11 DIAGNOSIS — R634 Abnormal weight loss: Secondary | ICD-10-CM | POA: Diagnosis not present

## 2023-12-11 DIAGNOSIS — R531 Weakness: Secondary | ICD-10-CM

## 2023-12-11 DIAGNOSIS — R1319 Other dysphagia: Secondary | ICD-10-CM

## 2023-12-11 DIAGNOSIS — R1310 Dysphagia, unspecified: Secondary | ICD-10-CM

## 2023-12-11 DIAGNOSIS — Z8719 Personal history of other diseases of the digestive system: Secondary | ICD-10-CM

## 2023-12-11 DIAGNOSIS — A419 Sepsis, unspecified organism: Secondary | ICD-10-CM

## 2023-12-11 LAB — CBC
HCT: 34.2 % — ABNORMAL LOW (ref 36.0–46.0)
Hemoglobin: 11.8 g/dL — ABNORMAL LOW (ref 12.0–15.0)
MCH: 30 pg (ref 26.0–34.0)
MCHC: 34.5 g/dL (ref 30.0–36.0)
MCV: 87 fL (ref 80.0–100.0)
Platelets: 125 10*3/uL — ABNORMAL LOW (ref 150–400)
RBC: 3.93 MIL/uL (ref 3.87–5.11)
RDW: 14 % (ref 11.5–15.5)
WBC: 5.5 10*3/uL (ref 4.0–10.5)
nRBC: 0 % (ref 0.0–0.2)

## 2023-12-11 LAB — MAGNESIUM: Magnesium: 2 mg/dL (ref 1.7–2.4)

## 2023-12-11 LAB — BASIC METABOLIC PANEL
Anion gap: 8 (ref 5–15)
BUN: 13 mg/dL (ref 8–23)
CO2: 23 mmol/L (ref 22–32)
Calcium: 8.1 mg/dL — ABNORMAL LOW (ref 8.9–10.3)
Chloride: 110 mmol/L (ref 98–111)
Creatinine, Ser: 0.63 mg/dL (ref 0.44–1.00)
GFR, Estimated: >60 mL/min (ref >60)
Glucose, Bld: 93 mg/dL (ref 70–99)
Potassium: 2.8 mmol/L — ABNORMAL LOW (ref 3.5–5.1)
Sodium: 141 mmol/L (ref 135–145)

## 2023-12-11 LAB — PHOSPHORUS: Phosphorus: 1.7 mg/dL — ABNORMAL LOW (ref 2.5–4.6)

## 2023-12-11 LAB — VITAMIN D 25 HYDROXY (VIT D DEFICIENCY, FRACTURES): Vit D, 25-Hydroxy: 60.68 ng/mL (ref 30–100)

## 2023-12-11 MED ORDER — POTASSIUM CHLORIDE 20 MEQ PO PACK: 40.0000 meq | PACK | Freq: Once | ORAL | Status: DC

## 2023-12-11 MED ORDER — POTASSIUM PHOSPHATES 15 MMOLE/5ML IV SOLN
30.0000 mmol | Freq: Once | INTRAVENOUS | Status: AC
  Administered 2023-12-11: 30 mmol via INTRAVENOUS
  Filled 2023-12-11: qty 10

## 2023-12-11 MED ORDER — CHLORHEXIDINE GLUCONATE CLOTH 2 % EX PADS
6.0000 | MEDICATED_PAD | Freq: Every day | CUTANEOUS | Status: DC
Start: 2023-12-11 — End: 2023-12-13
  Administered 2023-12-11 – 2023-12-12 (×2): 6 via TOPICAL

## 2023-12-11 MED ORDER — BOOST / RESOURCE BREEZE PO LIQD CUSTOM
1.0000 | Freq: Three times a day (TID) | ORAL | Status: DC
  Administered 2023-12-12: 1 via ORAL

## 2023-12-11 MED ORDER — POTASSIUM CHLORIDE 10 MEQ/100ML IV SOLN
10.0000 meq | INTRAVENOUS | Status: AC
  Administered 2023-12-11 (×5): 10 meq via INTRAVENOUS
  Filled 2023-12-11: qty 600
  Filled 2023-12-11 (×2): qty 100
  Filled 2023-12-11: qty 600
  Filled 2023-12-11 (×2): qty 100
  Filled 2023-12-11: qty 600
  Filled 2023-12-11: qty 100
  Filled 2023-12-11 (×2): qty 600
  Filled 2023-12-11 (×4): qty 100
  Filled 2023-12-11: qty 600
  Filled 2023-12-11: qty 100

## 2023-12-11 MED ORDER — POTASSIUM CHLORIDE IN NACL 20-0.9 MEQ/L-% IV SOLN
INTRAVENOUS | Status: AC
  Filled 2023-12-11 (×2): qty 1000

## 2023-12-11 MED ORDER — ADULT MULTIVITAMIN W/MINERALS CH
1.0000 | ORAL_TABLET | Freq: Every day | ORAL | Status: DC
  Administered 2023-12-11 – 2023-12-14 (×3): 1 via ORAL
  Filled 2023-12-11 (×3): qty 1

## 2023-12-11 MED ORDER — K PHOS MONO-SOD PHOS DI & MONO 155-852-130 MG PO TABS
500.0000 mg | ORAL_TABLET | ORAL | Status: DC
  Filled 2023-12-11 (×2): qty 2

## 2023-12-11 NOTE — NC FL2 (Signed)
   MEDICAID FL2 LEVEL OF CARE FORM     IDENTIFICATION  Patient Name: Marilyn Castillo Birthdate: January 13, 1946 Sex: female Admission Date (Current Location): 12/08/2023  [facility] and [place] Number:  Chiropodist and Address:  [facility], [address] [place], [place] [address]      Provider Number: [id number]  Attending Physician Name and Address:  Von Bellis, MD  Relative Name and Phone Number:       Current Level of Care: Hospital Recommended Level of Care: Skilled Nursing Facility Prior Approval Number:    Date Approved/Denied:   PASRR Number: [id number]  Discharge Plan: SNF    Current Diagnoses: Patient Active Problem List   Diagnosis Date Noted   Adult failure to thrive 12/10/2023   Episode of unresponsiveness 12/09/2023   Hypokalemia 12/09/2023   Sepsis (HCC) 12/09/2023   Lactic acidosis 12/09/2023   Metabolic acidosis 12/09/2023   Fecal impaction (HCC) 12/09/2023    Orientation RESPIRATION BLADDER Height & Weight     Time, Situation, Self, Place  Normal Incontinent, Indwelling catheter Weight: 157 lb 6.5 oz (71.4 kg) Height:  5' 7 (170.2 cm)  BEHAVIORAL SYMPTOMS/MOOD NEUROLOGICAL BOWEL NUTRITION STATUS   (None)  (None) Incontinent Diet (Regular)  AMBULATORY STATUS COMMUNICATION OF NEEDS Skin   Limited Assist Verbally Normal                       Personal Care Assistance Level of Assistance  Bathing, Feeding, Dressing Bathing Assistance: Limited assistance Feeding assistance: Limited assistance Dressing Assistance: Limited assistance     Functional Limitations Info  Sight, Hearing, Speech Sight Info: Adequate Hearing Info: Adequate Speech Info: Adequate    SPECIAL CARE FACTORS FREQUENCY  PT (By licensed PT)     PT Frequency: 5 x week              Contractures Contractures Info: Not present    Additional Factors Info  Code Status, Allergies Code Status Info: Full code Allergies  Info: Penicillins           Current Medications (12/11/2023):  This is the current hospital active medication list Current Facility-Administered Medications  Medication Dose Route Frequency Provider Last Rate Last Admin   acetaminophen  (TYLENOL ) tablet 650 mg  650 mg Oral Q6H PRN Crosley, Debby, MD       Or   acetaminophen  (TYLENOL ) suppository 650 mg  650 mg Rectal Q6H PRN Crosley, Debby, MD       Chlorhexidine  Gluconate Cloth 2 % PADS 6 each  6 each Topical Daily Von Bellis, MD   6 each at 12/11/23 1002   docusate sodium  (COLACE) capsule 100 mg  100 mg Oral BID Crosley, Debby, MD   100 mg at 12/11/23 9073   enoxaparin  (LOVENOX ) injection 40 mg  40 mg Subcutaneous Q24H Crosley, Debby, MD   40 mg at 12/11/23 9271   feeding supplement (BOOST / RESOURCE BREEZE) liquid 1 Container  1 Container Oral TID BM Von Bellis, MD       mirtazapine  (REMERON ) tablet 15 mg  15 mg Oral QHS Sreenath, Sudheer B, MD   15 mg at 12/10/23 2147   multivitamin with minerals tablet 1 tablet  1 tablet Oral Daily Von Bellis, MD       pantoprazole  (PROTONIX ) EC tablet 40 mg  40 mg Oral Daily Sreenath, Sudheer B, MD   40 mg at 12/11/23 0926   phosphorus (K PHOS  NEUTRAL) tablet 500 mg  500 mg Oral Q4H  Nazari, Walid A, RPH       polyethylene glycol (MIRALAX  / GLYCOLAX ) packet 17 g  17 g Oral BID Crosley, Debby, MD   17 g at 12/09/23 0341   potassium chloride  (KLOR-CON ) packet 40 mEq  40 mEq Oral Once Windle Ling A, RPH       senna-docusate (Senokot-S) tablet 1 tablet  1 tablet Oral QHS PRN Laveda Roosevelt, MD         Discharge Medications: Please see discharge summary for a list of discharge medications.  Relevant Imaging Results:  Relevant Lab Results:   Additional Information SS#: [id number]  Lauraine JAYSON Carpen, LCSW

## 2023-12-11 NOTE — Consult Note (Signed)
 Marilyn Copping, MD [facility]  [address], [address] [place], [place] [address] Phone: [phone] Fax : [phone]  Consultation  Referring Provider:     Dr. Von Primary Care Physician:  Glover Lenis, MD Primary Gastroenterologist:  Dr. Aundria         Reason for Consultation:     Dysphagia  Date of Admission:  12/08/2023 Date of Consultation:  12/11/2023         HPI:   Marilyn Castillo is a 78 y.o. female who reports that she has had trouble swallowing with weight loss.  The patient was admitted with a history of GERD and hypothyroidism with reporting of difficulty swallowing for couple of months.  The husband states that she has been home drinking only water.  The patient states that she has had weight loss but is not able to tell me how much weight loss she has had.  She states that she has become progressively weaker.  The patient had an episode of unresponsiveness but has been evaluated by cardiology.  During this unresponsive episode the patient's blood pressure was reported to be 36/24. The patient denies any nausea vomiting or diarrhea.  Due to the inability to eat and progressive decreased nutritional intake she has been feeling very weak.  Despite reported the patient has not been seen by GI in the past is in epic that the patient had a colonoscopy and upper endoscopy in 2018 by Dr. Aundria that showed diverticulosis with a 3 mm transverse colon polyp that was removed with internal hemorrhoids.  The upper endoscopy was done for early satiety nausea and weight loss and showed an irregular Z-line with no cause for her symptoms being seen.  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  Past Medical History:  Diagnosis Date   Acid reflux    Hypothyroidism     Past Surgical History:  Procedure Laterality Date   COLONOSCOPY WITH PROPOFOL  N/A 09/27/2017   Procedure: COLONOSCOPY WITH PROPOFOL ;  Surgeon: Toledo, Ladell POUR, MD;   Location: [facility] ENDOSCOPY;  Service: Gastroenterology;  Laterality: N/A;   ESOPHAGOGASTRODUODENOSCOPY (EGD) WITH PROPOFOL  N/A 09/27/2017   Procedure: ESOPHAGOGASTRODUODENOSCOPY (EGD) WITH PROPOFOL ;  Surgeon: Toledo, Ladell POUR, MD;  Location: [facility] ENDOSCOPY;  Service: Gastroenterology;  Laterality: N/A;   TUBAL LIGATION      Prior to Admission medications   Medication Sig Start Date End Date Taking? Authorizing Provider  pantoprazole  (PROTONIX ) 40 MG tablet Take 1 tablet (40 mg total) by mouth daily. Patient not taking: Reported on 09/27/2017 04/08/17 04/08/18  Dorothyann Drivers, MD    History reviewed. No pertinent family history.   Social History   Tobacco Use   Smoking status: Never   Smokeless tobacco: Never  Vaping Use   Vaping status: Never Used  Substance Use Topics   Alcohol use: No   Drug use: No    Allergies as of 12/08/2023 - Review Complete 12/08/2023  Allergen Reaction Noted   Penicillins Anaphylaxis 04/08/2017    Review of Systems:    All systems reviewed and negative except where noted in HPI.   Physical Exam:  Vital signs in last 24 hours: Temp:  [97.6 F (36.4 C)-98 F (36.7 C)] 97.8 F (36.6 C) (01/06 1540) Pulse Rate:  [55-64] 57 (01/06 1540) Resp:  [12-18] 16 (01/06 1540) BP: (104-130)/(52-64) 104/57 (01/06 1540) SpO2:  [94 %-100 %] 100 % (01/06 1540) Last BM Date : 12/11/23 General:  Pleasant, cooperative in NAD Head:  Normocephalic and atraumatic. Eyes:   No icterus.   Conjunctiva pink. PERRLA. Ears:  Normal auditory acuity. Neck:  Supple; no masses or thyroidomegaly Lungs: Respirations even and unlabored. Lungs clear to auscultation bilaterally.   No wheezes, crackles, or rhonchi.  Heart:  Regular rate and rhythm;  Without murmur, clicks, rubs or gallops Abdomen:  Soft, nondistended, nontender. Normal bowel sounds. No appreciable masses or hepatomegaly.  No rebound or guarding.  Rectal:  Not performed. Msk:  Symmetrical without gross  deformities.    Extremities:  Without edema, cyanosis or clubbing. Neurologic:  Alert and oriented x3;  grossly normal neurologically. Skin:  Intact without significant lesions or rashes. Cervical Nodes:  No significant cervical adenopathy. Psych:  Alert and cooperative. Normal affect.  LAB RESULTS: Recent Labs    12/09/23 0638 12/11/23 0914  WBC 6.4 5.5  HGB 14.4 11.8*  HCT 41.5 34.2*  PLT 204 125*   BMET Recent Labs    12/09/23 0638 12/11/23 0914  NA 137 141  K 3.2* 2.8*  CL 98 110  CO2 22 23  GLUCOSE 130* 93  BUN 23 13  CREATININE 0.71 0.63  CALCIUM 8.6* 8.1*   LFT No results for input(s): PROT, ALBUMIN, AST, ALT, ALKPHOS, BILITOT, BILIDIR, IBILI in the last 72 hours. PT/INR No results for input(s): LABPROT, INR in the last 72 hours.  STUDIES: ECHOCARDIOGRAM COMPLETE Result Date: 12/10/2023    ECHOCARDIOGRAM REPORT   Patient Name:   Marilyn Castillo Date of Exam: 12/10/2023 Medical Rec #:  [id number]      Height:       67.0 in Accession #:    [id number]     Weight:       157.4 lb Date of Birth:  March 24, 1946     BSA:          1.827 m Patient Age:    77 years       BP:           105/42 mmHg Patient Gender: F              HR:           57 bpm. Exam Location:  [facility] Procedure: 2D Echo Indications:     Syncope R55  History:         Patient has no prior history of Echocardiogram examinations.  Sonographer:     Thedora Louder RDCS,FASE Referring Phys:  [id number] DEBBY CROSLEY Diagnosing Phys: Cara JONETTA Lovelace MD IMPRESSIONS  1. Left ventricular ejection fraction, by estimation, is 65 to 70%. The left ventricle has normal function. The left ventricle has no regional wall motion abnormalities. Left ventricular diastolic parameters are consistent with Grade I diastolic dysfunction (impaired relaxation).  2. Right ventricular systolic function is normal. The right ventricular size is normal.  3. The mitral valve is normal in structure. No evidence of mitral valve  regurgitation.  4. The aortic valve is grossly normal. Aortic valve regurgitation is not visualized. FINDINGS  Left Ventricle: Left ventricular ejection fraction, by estimation, is 65 to 70%. The left ventricle has normal function. The left ventricle has no regional wall motion abnormalities. The left ventricular internal cavity size was normal in size. There is  no left ventricular hypertrophy. Left ventricular diastolic parameters are consistent with Grade I diastolic dysfunction (impaired relaxation). Right Ventricle: The right ventricular size is normal. No increase in right ventricular wall thickness. Right ventricular systolic function is normal. Left Atrium: Left atrial size was  normal in size. Right Atrium: Right atrial size was normal in size. Pericardium: There is no evidence of pericardial effusion. Mitral Valve: The mitral valve is normal in structure. No evidence of mitral valve regurgitation. Tricuspid Valve: The tricuspid valve is normal in structure. Tricuspid valve regurgitation is not demonstrated. Aortic Valve: The aortic valve is grossly normal. Aortic valve regurgitation is not visualized. Aortic valve peak gradient measures 9.5 mmHg. Pulmonic Valve: The pulmonic valve was normal in structure. Pulmonic valve regurgitation is not visualized. Aorta: The ascending aorta was not well visualized. IAS/Shunts: No atrial level shunt detected by color flow Doppler.  LEFT VENTRICLE PLAX 2D LVIDd:         4.80 cm   Diastology LVIDs:         3.00 cm   LV e' medial:    7.40 cm/s LV PW:         1.10 cm   LV E/e' medial:  10.7 LV IVS:        1.10 cm   LV e' lateral:   8.70 cm/s LVOT diam:     1.80 cm   LV E/e' lateral: 9.1 LV SV:         76 LV SV Index:   41 LVOT Area:     2.54 cm  RIGHT VENTRICLE RV Basal diam:  2.90 cm RV S prime:     13.40 cm/s LEFT ATRIUM           Index        RIGHT ATRIUM           Index LA diam:      4.00 cm 2.19 cm/m   RA Area:     10.30 cm LA Vol (A2C): 23.1 ml 12.65 ml/m  RA  Volume:   21.30 ml  11.66 ml/m LA Vol (A4C): 43.3 ml 23.71 ml/m  AORTIC VALVE                  PULMONIC VALVE AV Area (Vmax): 2.58 cm      RVOT Peak grad: 3 mmHg AV Vmax:        154.00 cm/s AV Peak Grad:   9.5 mmHg LVOT Vmax:      156.00 cm/s LVOT Vmean:     103.000 cm/s LVOT VTI:       0.297 m  AORTA Ao Root diam: 3.10 cm MITRAL VALVE               TRICUSPID VALVE MV Area (PHT): 3.24 cm    TR Peak grad:   20.4 mmHg MV Decel Time: 234 msec    TR Vmax:        226.00 cm/s MV E velocity: 79.10 cm/s MV A velocity: 80.70 cm/s  SHUNTS MV E/A ratio:  0.98        Systemic VTI:  0.30 m                            Systemic Diam: 1.80 cm Dwayne JONETTA Lovelace MD Electronically signed by Cara JONETTA Lovelace MD Signature Date/Time: 12/10/2023/4:58:25 PM    Final       Impression / Plan:   Assessment: Principal Problem:   Sepsis (HCC) Active Problems:   Episode of unresponsiveness   Hypokalemia   Lactic acidosis   Metabolic acidosis   Fecal impaction (HCC)   Adult failure to thrive   Addalee Kavanagh is a 78 y.o. y/o female with dysphagia for some  time.  The patient has a history of GERD with this dysphagia.  Plan:  Due to the progressive weakness and dysphagia with weight loss the patient will be set up for an upper endoscopy for tomorrow.  The patient and her husband have been explained the plan and agree with it.  Thank you for involving me in the care of this patient.      LOS: 2 days   Marilyn Copping, MD, [facility] 12/11/2023, 5:37 PM,  Pager [phone] 7am-5pm  Check AMION for 5pm -7am coverage and on weekends   Note: This dictation was prepared with Dragon dictation along with smaller phrase technology. Any transcriptional errors that result from this process are unintentional.

## 2023-12-11 NOTE — TOC Initial Note (Signed)
 Transition of Care [facility]) - Initial/Assessment Note    Patient Details  Name: Marilyn Castillo MRN: [id number] Date of Birth: 05/18/1946  Transition of Care [facility]) CM/SW Contact:    Lauraine JAYSON Carpen, LCSW Phone Number: 12/11/2023, 3:21 PM  Clinical Narrative: CSW met with patient. Husband at bedside. CSW introduced role and explained that PT recommendations would be discussed. Patient is unsure if she wants SNF or not and would like time to consider. CSW told her referral would be sent out to determine what her options are. No further concerns. CSW will continue to follow patient and her husband for support and facilitate discharge once medically stable.                  Expected Discharge Plan:  (TBD) Barriers to Discharge: Continued Medical Work up   Patient Goals and CMS Choice            Expected Discharge Plan and Services     Post Acute Care Choice:  (TBD) Living arrangements for the past 2 months: Single Family Home                                      Prior Living Arrangements/Services Living arrangements for the past 2 months: Single Family Home Lives with:: Spouse Patient language and need for interpreter reviewed:: Yes Do you feel safe going back to the place where you live?: Yes      Need for Family Participation in Patient Care: Yes (Comment) Care giver support system in place?: Yes (comment)   Criminal Activity/Legal Involvement Pertinent to Current Situation/Hospitalization: No - Comment as needed  Activities of Daily Living   ADL Screening (condition at time of admission) Independently performs ADLs?: No Does the patient have a NEW difficulty with bathing/dressing/toileting/self-feeding that is expected to last >3 days?: Yes (Initiates electronic notice to provider for possible OT consult) Does the patient have a NEW difficulty with getting in/out of bed, walking, or climbing stairs that is expected to last >3 days?: Yes (Initiates electronic notice  to provider for possible PT consult) Does the patient have a NEW difficulty with communication that is expected to last >3 days?: No Is the patient deaf or have difficulty hearing?: No Does the patient have difficulty seeing, even when wearing glasses/contacts?: No Does the patient have difficulty concentrating, remembering, or making decisions?: No  Permission Sought/Granted Permission sought to share information with : Facility Medical Sales Representative, Family Supports Permission granted to share information with : Yes, Verbal Permission Granted  Share Information with NAME: Meg Niemeier  Permission granted to share info w AGENCY: SNF's  Permission granted to share info w Relationship: Husband  Permission granted to share info w Contact Information: [phone]  Emotional Assessment Appearance:: Appears stated age Attitude/Demeanor/Rapport: Engaged, Gracious Affect (typically observed): Accepting, Appropriate, Calm, Pleasant Orientation: : Oriented to Self, Oriented to Place, Oriented to  Time, Oriented to Situation Alcohol / Substance Use: Not Applicable Psych Involvement: No (comment)  Admission diagnosis:  Adult failure to thrive [R62.7] Weakness [R53.1] Hypotensive episode [I95.9] Sepsis [facility]) [A41.9] Patient Active Problem List   Diagnosis Date Noted   Adult failure to thrive 12/10/2023   Episode of unresponsiveness 12/09/2023   Hypokalemia 12/09/2023   Sepsis (HCC) 12/09/2023   Lactic acidosis 12/09/2023   Metabolic acidosis 12/09/2023   Fecal impaction (HCC) 12/09/2023   PCP:  Glover Lenis, MD Pharmacy:   [facility] [phone] [address],  [facility] - [address] [place] [address] Phone: [phone] Fax: [phone]     Social Drivers of Health (SDOH) Social History: SDOH Screenings   Food Insecurity: No Food Insecurity (12/11/2023)  Housing: Unknown (12/11/2023)  Transportation Needs: No Transportation Needs (12/11/2023)  Utilities:  Not At Risk (12/11/2023)  Financial Resource Strain: Low Risk  (10/02/2023)   Received from [facility]  Social Connections: Socially Integrated (12/11/2023)  Tobacco Use: Low Risk  (12/08/2023)   SDOH Interventions:     Readmission Risk Interventions     No data to display

## 2023-12-11 NOTE — Progress Notes (Signed)
 [facility] CLINIC CARDIOLOGY PROGRESS NOTE   Patient ID: Marilyn Castillo MRN: [id number] DOB/AGE: [birth year] 78 y.o.  Admit date: 12/08/2023 Referring Physician Dr Calvin Robson  Primary Physician Glover Lenis, MD  Primary Cardiologist none Reason for Consultation unresponsive episode  HPI: Marilyn Castillo is a 78 y.o. female with a past medical history of GERD, hypothyroidism who presented to the ED on 12/08/2023 for weakness and dysphagia.  Concern for sepsis initially, patient had unresponsive episode in the ED with significantly low BP and possible pulselessness but shortly after regained consciousness spontaneously.  Cardiology was consulted for further evaluation.  Interval History:  -Patient seen and examined this morning, reports she is feeling better overall today. -BP and heart rate remained stable.  She is off of telemetry but reportedly had no evidence of any significant bradycardia or arrhythmias. -Echo results reviewed with patient.  Review of systems complete and found to be negative unless listed above    Vitals:   12/11/23 0600 12/11/23 0731 12/11/23 0731 12/11/23 0930  BP: (!) 121/58 130/62  129/64  Pulse: (!) 55 63  64  Resp: 13 13  18   Temp:   97.6 F (36.4 C) 97.7 F (36.5 C)  TempSrc:   Axillary Oral  SpO2: 98% 96%  96%  Weight:      Height:         Intake/Output Summary (Last 24 hours) at 12/11/2023 1033 Last data filed at 12/11/2023 0816 Gross per 24 hour  Intake 1662.67 ml  Output 400 ml  Net 1262.67 ml     PHYSICAL EXAM General: Chronically ill-appearing elderly female, well nourished, in no acute distress resting comfortably in hospital bed with husband present at bedside. HEENT: Normocephalic and atraumatic. Neck: No JVD.  Lungs: Normal respiratory effort on room air. Clear bilaterally to auscultation. No wheezes, crackles, rhonchi.  Heart: HRRR. Normal S1 and S2 without gallops or murmurs. Radial & DP pulses 2+ bilaterally. Abdomen:  Non-distended appearing.  Msk: Normal strength and tone for age. Extremities: No clubbing, cyanosis or edema.   Neuro: Alert and oriented X 3. Psych: Mood appropriate, affect congruent.    LABS: Basic Metabolic Panel: Recent Labs    12/09/23 0638 12/11/23 0914  NA 137 141  K 3.2* 2.8*  CL 98 110  CO2 22 23  GLUCOSE 130* 93  BUN 23 13  CREATININE 0.71 0.63  CALCIUM 8.6* 8.1*  MG 2.4 2.0  PHOS  --  1.7*   Liver Function Tests: Recent Labs    12/08/23 1704  AST 19  ALT 13  ALKPHOS 76  BILITOT 2.2*  PROT 7.0  ALBUMIN 3.9   No results for input(s): LIPASE, AMYLASE in the last 72 hours. CBC: Recent Labs    12/08/23 1704 12/09/23 0638 12/11/23 0914  WBC 6.1 6.4 5.5  NEUTROABS 3.5  --   --   HGB 17.8* 14.4 11.8*  HCT 51.8* 41.5 34.2*  MCV 86.5 85.4 87.0  PLT 245 204 125*   Cardiac Enzymes: Recent Labs    12/08/23 1704 12/08/23 1942  CKTOTAL 17*  --   TROPONINIHS 7 6   BNP: No results for input(s): BNP in the last 72 hours. D-Dimer: No results for input(s): DDIMER in the last 72 hours. Hemoglobin A1C: No results for input(s): HGBA1C in the last 72 hours. Fasting Lipid Panel: No results for input(s): CHOL, HDL, LDLCALC, TRIG, CHOLHDL, LDLDIRECT in the last 72 hours. Thyroid Function Tests: No results for input(s): TSH, T4TOTAL, T3FREE, THYROIDAB in the last 72  hours.  Invalid input(s): FREET3 Anemia Panel: No results for input(s): VITAMINB12, FOLATE, FERRITIN, TIBC, IRON, RETICCTPCT in the last 72 hours.  ECHOCARDIOGRAM COMPLETE Result Date: 12/10/2023    ECHOCARDIOGRAM REPORT   Patient Name:   Marilyn Castillo Date of Exam: 12/10/2023 Medical Rec #:  [id number]      Height:       67.0 in Accession #:    [id number]     Weight:       157.4 lb Date of Birth:  [birth year]     BSA:          1.827 m Patient Age:    78 years       BP:           105/42 mmHg Patient Gender: F              HR:           57 bpm. Exam Location:   [facility] Procedure: 2D Echo Indications:     Syncope R55  History:         Patient has no prior history of Echocardiogram examinations.  Sonographer:     Thedora Louder RDCS,FASE Referring Phys:  [id number] DEBBY CROSLEY Diagnosing Phys: Cara JONETTA Lovelace MD IMPRESSIONS  1. Left ventricular ejection fraction, by estimation, is 65 to 70%. The left ventricle has normal function. The left ventricle has no regional wall motion abnormalities. Left ventricular diastolic parameters are consistent with Grade I diastolic dysfunction (impaired relaxation).  2. Right ventricular systolic function is normal. The right ventricular size is normal.  3. The mitral valve is normal in structure. No evidence of mitral valve regurgitation.  4. The aortic valve is grossly normal. Aortic valve regurgitation is not visualized. FINDINGS  Left Ventricle: Left ventricular ejection fraction, by estimation, is 65 to 70%. The left ventricle has normal function. The left ventricle has no regional wall motion abnormalities. The left ventricular internal cavity size was normal in size. There is  no left ventricular hypertrophy. Left ventricular diastolic parameters are consistent with Grade I diastolic dysfunction (impaired relaxation). Right Ventricle: The right ventricular size is normal. No increase in right ventricular wall thickness. Right ventricular systolic function is normal. Left Atrium: Left atrial size was normal in size. Right Atrium: Right atrial size was normal in size. Pericardium: There is no evidence of pericardial effusion. Mitral Valve: The mitral valve is normal in structure. No evidence of mitral valve regurgitation. Tricuspid Valve: The tricuspid valve is normal in structure. Tricuspid valve regurgitation is not demonstrated. Aortic Valve: The aortic valve is grossly normal. Aortic valve regurgitation is not visualized. Aortic valve peak gradient measures 9.5 mmHg. Pulmonic Valve: The pulmonic valve was normal in structure.  Pulmonic valve regurgitation is not visualized. Aorta: The ascending aorta was not well visualized. IAS/Shunts: No atrial level shunt detected by color flow Doppler.  LEFT VENTRICLE PLAX 2D LVIDd:         4.80 cm   Diastology LVIDs:         3.00 cm   LV e' medial:    7.40 cm/s LV PW:         1.10 cm   LV E/e' medial:  10.7 LV IVS:        1.10 cm   LV e' lateral:   8.70 cm/s LVOT diam:     1.80 cm   LV E/e' lateral: 9.1 LV SV:         76 LV SV Index:   41 LVOT Area:  2.54 cm  RIGHT VENTRICLE RV Basal diam:  2.90 cm RV S prime:     13.40 cm/s LEFT ATRIUM           Index        RIGHT ATRIUM           Index LA diam:      4.00 cm 2.19 cm/m   RA Area:     10.30 cm LA Vol (A2C): 23.1 ml 12.65 ml/m  RA Volume:   21.30 ml  11.66 ml/m LA Vol (A4C): 43.3 ml 23.71 ml/m  AORTIC VALVE                  PULMONIC VALVE AV Area (Vmax): 2.58 cm      RVOT Peak grad: 3 mmHg AV Vmax:        154.00 cm/s AV Peak Grad:   9.5 mmHg LVOT Vmax:      156.00 cm/s LVOT Vmean:     103.000 cm/s LVOT VTI:       0.297 m  AORTA Ao Root diam: 3.10 cm MITRAL VALVE               TRICUSPID VALVE MV Area (PHT): 3.24 cm    TR Peak grad:   20.4 mmHg MV Decel Time: 234 msec    TR Vmax:        226.00 cm/s MV E velocity: 79.10 cm/s MV A velocity: 80.70 cm/s  SHUNTS MV E/A ratio:  0.98        Systemic VTI:  0.30 m                            Systemic Diam: 1.80 cm Cara JONETTA Lovelace MD Electronically signed by Cara JONETTA Lovelace MD Signature Date/Time: 12/10/2023/4:58:25 PM    Final      ECHO as above  TELEMETRY reviewed by me 12/11/23: Not on telemetry  EKG reviewed by me 12/11/23: Normal sinus rhythm rate of 90 nonspecific ST-T changes   DATA reviewed by me 12/11/23: last 24h vitals tele labs imaging I/O, hospitalist progress note  Principal Problem:   Sepsis (HCC) Active Problems:   Episode of unresponsiveness   Hypokalemia   Lactic acidosis   Metabolic acidosis   Fecal impaction (HCC)   Adult failure to thrive    ASSESSMENT AND  PLAN: Jaasia Viglione is a 78 y.o. female with a past medical history of GERD, hypothyroidism who presented to the ED on 12/08/2023 for weakness and dysphagia.  Concern for sepsis initially, patient had unresponsive episode in the ED with significantly low BP and possible pulselessness but shortly after regained consciousness spontaneously.  Cardiology was consulted for further evaluation.  # Sepsis # Episode of unresponsiveness Patient initially presented to ED for weakness and dysphagia, had episode of unresponsiveness in the ED.  Was noted to be hypotensive in the ED with lactic acidosis, treated for sepsis.  Echo this admission with preserved EF and no valvular abnormalities.  No significant episodes of bradycardia arrhythmias on telemetry prior to this being discontinued. -Workup unrevealing thus far for cause of unresponsive episode. -No further cardiac procedures planned/recommended at this time. -Further management of sepsis and dysphagia per primary.  Cardiology will sign off. Please haiku with questions or re-engage if needed.    This patient's case was discussed and created with Dr. Ammon and he is in agreement.  Signed:  Danita Bloch, PA-C  12/11/2023, 10:33 AM [facility] Cardiology

## 2023-12-11 NOTE — Progress Notes (Signed)
 Initial Nutrition Assessment  DOCUMENTATION CODES:   Not applicable  INTERVENTION:   -D/c Ensure Enlive po BID, each supplement provides 350 kcal and 20 grams of protein.  -Boost Breeze po TID, each supplement provides 250 kcal and 9 grams of protein  -Magic cup TID with meals, each supplement provides 290 kcal and 9 grams of protein  -MVI with minerals daily   NUTRITION DIAGNOSIS:   Inadequate oral intake related to poor appetite as evidenced by per patient/family report.  GOAL:   Patient will meet greater than or equal to 90% of their needs  MONITOR:   PO intake, Supplement acceptance  REASON FOR ASSESSMENT:   Consult Assessment of nutrition requirement/status  ASSESSMENT:   Pt with past medical history significant for hypothyroidism and GERD. Admitted with dysphagia and FTT.  Pt admitted with adult FTT.   1/4- s/p BSE- regular diet  Per MD notes, plan for barium swallow study and possible EGD. Husband at bedside confirms this plan.   Spoke with pt and husband at bedside. Pt reports that she was able to eat a little today- consumed about 50% of fruit and meatloaf today. Per pt, she reports everything is tasteless or tastes bad. Pt husband added that pt has had difficulty swallowing for the past 4-6 weeks, which progressed drinking only water for the past 2 weeks. Per pt, this was the only thing she was able to keep down.   Pt reports she has tried Ensure in the past, but does not like it. Noted she refused these supplements here. RD reviewed other supplements on the formulary; pt amenable to [employer] and [employer]. Offered to have pt try them, however, she refused and states she will try them when scheduled.   Pt husband reports wt loss, however, unsure of UBW or how much weight she has lsot. No significant wt loss noted since 2018.   Discussed importance of good meal and supplement intake to promote healing.   Medications reviewed and include colace, remeron ,  protonix , miralax , potassium phosplahte, and 0.9% NaCl with KCl 20 mEq/L infusion @ 75 ml/hr.   Labs reviewed: K: 2.8.   NUTRITION - FOCUSED PHYSICAL EXAM:  Flowsheet Row Most Recent Value  Orbital Region No depletion  Upper Arm Region Mild depletion  Thoracic and Lumbar Region No depletion  Buccal Region No depletion  Temple Region No depletion  Clavicle Bone Region No depletion  Clavicle and Acromion Bone Region No depletion  Scapular Bone Region No depletion  Dorsal Hand Mild depletion  Patellar Region Mild depletion  Anterior Thigh Region Mild depletion  Posterior Calf Region Mild depletion  Edema (RD Assessment) None  Hair Reviewed  Eyes Reviewed  Mouth Reviewed  Skin Reviewed  Nails Reviewed       Diet Order:   Diet Order             Diet NPO time specified  Diet effective midnight           Diet regular Fluid consistency: Thin  Diet effective now                   EDUCATION NEEDS:   Education needs have been addressed  Skin:  Skin Assessment: Reviewed RN Assessment  Last BM:  12/11/23  Height:   Ht Readings from Last 1 Encounters:  12/09/23 5' 7 (1.702 m)    Weight:   Wt Readings from Last 1 Encounters:  12/08/23 71.4 kg    Ideal Body Weight:  61.4  kg  BMI:  Body mass index is 24.65 kg/m.  Estimated Nutritional Needs:   Kcal:  1650-1850  Protein:  85-100 grams  Fluid:  > 1.6 L    Margery ORN, RD, LDN, CDCES Registered Dietitian III Certified Diabetes Care and Education Specialist If unable to reach this RD, please use RD Inpatient group chat on secure chat between hours of 8am-4 pm daily

## 2023-12-11 NOTE — Progress Notes (Addendum)
 PROGRESS NOTE    Marilyn Castillo  FMW:[id number] DOB: [birth year] DOA: 12/08/2023 PCP: Glover Lenis, MD    Brief Narrative:  78 year old female who does not follow regularly with doctors who apparently had difficulty swallowing for the past couple months.  This has been worsening in severity to the point that now she is only drinking water.  Not able to tolerate it does not consume any protein supplements.  Reports weight loss and progressive weakness.  Denies nausea vomiting or diarrhea.  Does endorse constipation.  Denies chest pain.  Was brought to the ER at the urging of her husband.  Apparently patient has not been following up with outpatient physicians and has not been seen by gastroenterology in the past.   While in triage patient suddenly became unresponsive.  No pulse was found.  Blood pressure apparently 36/24.  Not responsive to noxious stimulus.  Patient did have spontaneous recovery.  Did not require endotracheal intubation no chest compressions.   Etiology of dysphagia is unclear.  Patient is a poor historian.  I will engage consultation from SLP to evaluate for oropharyngeal dysphagia.  If this is unrevealing we will consider GI consultation for endoluminal evaluation  1/5: Seen by SLP.  No evidence of oropharyngeal dysphagia.  Diet advanced to regular with thin liquids.  On 1/5 AM I noted the patient to be eating and swallowing without issue with husband at bedside.   Assessment & Plan:   Principal Problem:   Sepsis (HCC) Active Problems:   Episode of unresponsiveness   Hypokalemia   Lactic acidosis   Metabolic acidosis   Fecal impaction (HCC)   Adult failure to thrive  # Inability to eat, decreased p.o. intake intake # Adult failure to thrive Patient presented with suppose it inability to eat since October.  Was initially doing Ensure shakes but deteriorated to where she was only consuming water.   Patient was cleared by SLP for regular diet with thin liquids.  No  signs or symptoms of oropharyngeal dysphagia.   Possible esophageal dysphagia   Plan: Regular diet as tolerated PPI RD consultation for dietary education 1/6 follow-up barium swallow study Follow GI for possible EGD Keep n.p.o. after midnight   # Hypothermia # Lactic acidosis Brief episode of hypotension and unresponsiveness The episode associated with hypotension and unresponsiveness was noted in triage.  Supposedly systolic blood pressure in the 30s.  This resolved spontaneously without intervention.  Very low suspicion for true cardiac arrest.  Seen in consultation by cardiology.  No plans for ischemic evaluation.  Cardiology signed off No evidence of sepsis.  Sepsis ruled out Plan: Stop antibiotics Monitor vitals and fever curve Fall precautions  # Hypokalemia, potassium repleted. # Hypomagnesemia, mag repleted. # Hypophosphatemia, Phos repleted. Monitor and replace as necessary  # Fecal impaction/constipation Resolved Initiated bowel regimen   DVT prophylaxis: Lovenox  Code Status: Full Family Communication: Spouse at bedside 1/6 Disposition Plan: Status is: Inpatient Remains inpatient appropriate because: Adult failure to thrive   Level of care: Med-Surg  Consultants:  Cardio GI  Procedures:  None  Antimicrobials: None   Subjective: No significant events overnight, patient was laying comfortably, denied any abdominal pain.  Patient had BM after enema.  No chest pain or palpitation no shortness of breath. Patient is complaining of difficulty keeping the food down, she is being throwing up what ever she eats. Chane agreed for further workup for barium swallow and GI consult for possible EGD.   Objective: Vitals:   12/11/23 9268 12/11/23 9268  12/11/23 0930 12/11/23 1540  BP: 130/62  129/64 (!) 104/57  Pulse: 63  64 (!) 57  Resp: 13  18 16   Temp:  97.6 F (36.4 C) 97.7 F (36.5 C) 97.8 F (36.6 C)  TempSrc:  Axillary Oral   SpO2: 96%  96% 100%   Weight:      Height:        Intake/Output Summary (Last 24 hours) at 12/11/2023 1559 Last data filed at 12/11/2023 0816 Gross per 24 hour  Intake 1474.15 ml  Output 400 ml  Net 1074.15 ml   Filed Weights   12/08/23 1748  Weight: 71.4 kg    Examination:  General exam: NAD Respiratory system: Lungs clear.  Normal work of breathing.  Room air Cardiovascular system: S1-S2, RRR, no murmurs, no pedal edema Gastrointestinal system: Soft, NT/ND, hyperactive bowel sounds Central nervous system: Alert and oriented. No focal neurological deficits. Extremities: Symmetric 5 x 5 power. Skin: No rashes, lesions or ulcers Psychiatry: Judgement and insight appear normal. Mood & affect flattened.     Data Reviewed: I have personally reviewed following labs and imaging studies  CBC: Recent Labs  Lab 12/08/23 1704 12/09/23 0638 12/11/23 0914  WBC 6.1 6.4 5.5  NEUTROABS 3.5  --   --   HGB 17.8* 14.4 11.8*  HCT 51.8* 41.5 34.2*  MCV 86.5 85.4 87.0  PLT 245 204 125*   Basic Metabolic Panel: Recent Labs  Lab 12/08/23 1704 12/08/23 1942 12/09/23 0638 12/11/23 0914  NA 134*  --  137 141  K 3.2*  --  3.2* 2.8*  CL 94*  --  98 110  CO2 18*  --  22 23  GLUCOSE 102*  --  130* 93  BUN 26*  --  23 13  CREATININE 0.66  --  0.71 0.63  CALCIUM 9.4  --  8.6* 8.1*  MG  --  1.6* 2.4 2.0  PHOS  --   --   --  1.7*   GFR: Estimated Creatinine Clearance: 55.5 mL/min (by C-G formula based on SCr of 0.63 mg/dL). Liver Function Tests: Recent Labs  Lab 12/08/23 1704  AST 19  ALT 13  ALKPHOS 76  BILITOT 2.2*  PROT 7.0  ALBUMIN 3.9   No results for input(s): LIPASE, AMYLASE in the last 168 hours. No results for input(s): AMMONIA in the last 168 hours. Coagulation Profile: Recent Labs  Lab 12/08/23 1704  INR 1.2   Cardiac Enzymes: Recent Labs  Lab 12/08/23 1704  CKTOTAL 17*   BNP (last 3 results) No results for input(s): PROBNP in the last 8760 hours. HbA1C: No  results for input(s): HGBA1C in the last 72 hours. CBG: No results for input(s): GLUCAP in the last 168 hours. Lipid Profile: No results for input(s): CHOL, HDL, LDLCALC, TRIG, CHOLHDL, LDLDIRECT in the last 72 hours. Thyroid Function Tests: No results for input(s): TSH, T4TOTAL, FREET4, T3FREE, THYROIDAB in the last 72 hours. Anemia Panel: No results for input(s): VITAMINB12, FOLATE, FERRITIN, TIBC, IRON, RETICCTPCT in the last 72 hours. Sepsis Labs: Recent Labs  Lab 12/08/23 1708 12/08/23 1942 12/09/23 9361  PROCALCITON  --   --  <0.10  LATICACIDVEN 2.8* 2.2*  --     Recent Results (from the past 240 hours)  Resp panel by RT-PCR (RSV, Flu A&B, Covid) Anterior Nasal Swab     Status: None   Collection Time: 12/08/23  5:17 PM   Specimen: Anterior Nasal Swab  Result Value Ref Range Status   SARS Coronavirus  2 by RT PCR NEGATIVE NEGATIVE Final    Comment: (NOTE) SARS-CoV-2 target nucleic acids are NOT DETECTED.  The SARS-CoV-2 RNA is generally detectable in upper respiratory specimens during the acute phase of infection. The lowest concentration of SARS-CoV-2 viral copies this assay can detect is 138 copies/mL. A negative result does not preclude SARS-Cov-2 infection and should not be used as the sole basis for treatment or other patient management decisions. A negative result may occur with  improper specimen collection/handling, submission of specimen other than nasopharyngeal swab, presence of viral mutation(s) within the areas targeted by this assay, and inadequate number of viral copies(<138 copies/mL). A negative result must be combined with clinical observations, patient history, and epidemiological information. The expected result is Negative.  Fact Sheet for Patients:  bloggercourse.com  Fact Sheet for Healthcare Providers:  seriousbroker.it  This test is no t yet approved or  cleared by the [place]  FDA and  has been authorized for detection and/or diagnosis of SARS-CoV-2 by FDA under an Emergency Use Authorization (EUA). This EUA will remain  in effect (meaning this test can be used) for the duration of the COVID-19 declaration under Section 564(b)(1) of the Act, 21 U.S.C.section 360bbb-3(b)(1), unless the authorization is terminated  or revoked sooner.       Influenza A by PCR NEGATIVE NEGATIVE Final   Influenza B by PCR NEGATIVE NEGATIVE Final    Comment: (NOTE) The Xpert Xpress SARS-CoV-2/FLU/RSV plus assay is intended as an aid in the diagnosis of influenza from Nasopharyngeal swab specimens and should not be used as a sole basis for treatment. Nasal washings and aspirates are unacceptable for Xpert Xpress SARS-CoV-2/FLU/RSV testing.  Fact Sheet for Patients: bloggercourse.com  Fact Sheet for Healthcare Providers: seriousbroker.it  This test is not yet approved or cleared by the [place]  FDA and has been authorized for detection and/or diagnosis of SARS-CoV-2 by FDA under an Emergency Use Authorization (EUA). This EUA will remain in effect (meaning this test can be used) for the duration of the COVID-19 declaration under Section 564(b)(1) of the Act, 21 U.S.C. section 360bbb-3(b)(1), unless the authorization is terminated or revoked.     Resp Syncytial Virus by PCR NEGATIVE NEGATIVE Final    Comment: (NOTE) Fact Sheet for Patients: bloggercourse.com  Fact Sheet for Healthcare Providers: seriousbroker.it  This test is not yet approved or cleared by the [place]  FDA and has been authorized for detection and/or diagnosis of SARS-CoV-2 by FDA under an Emergency Use Authorization (EUA). This EUA will remain in effect (meaning this test can be used) for the duration of the COVID-19 declaration under Section 564(b)(1) of the Act, 21  U.S.C. section 360bbb-3(b)(1), unless the authorization is terminated or revoked.  Performed at [facility], [address] [place], [place] [address]   Blood Culture (routine x 2)     Status: None (Preliminary result)   Collection Time: 12/08/23  5:17 PM   Specimen: BLOOD  Result Value Ref Range Status   Specimen Description BLOOD RIGHT ANTECUBITAL  Final   Special Requests   Final    BOTTLES DRAWN AEROBIC AND ANAEROBIC Blood Culture results may not be optimal due to an inadequate volume of blood received in culture bottles   Culture   Final    NO GROWTH 3 DAYS Performed at [facility], [address] [place], [place] [address]    Report Status PENDING  Incomplete  Urine Culture     Status: None   Collection Time: 12/09/23  5:17 AM   Specimen: Urine, Random  Result Value Ref Range Status   Specimen Description   Final    URINE, RANDOM Performed at [facility], [address] [place], [place] [address]    Special Requests   Final    NONE Reflexed from [id number] Performed at [facility], [address] [place], [place] [address]    Culture   Final    NO GROWTH Performed at [facility], [address] [place], [place] [address]    Report Status 12/10/2023 FINAL  Final         Radiology Studies: ECHOCARDIOGRAM COMPLETE Result Date: 12/10/2023    ECHOCARDIOGRAM REPORT   Patient Name:   [facility] Date of Exam: 12/10/2023 Medical Rec #:  [id number]      Height:       67.0 in Accession #:    [id number]     Weight:       157.4 lb Date of Birth:  [birth year]     BSA:          1.827 m Patient Age:    77 years       BP:           105/42 mmHg Patient Gender: F              HR:           57 bpm. Exam Location:  [facility] Procedure: 2D Echo Indications:     Syncope R55  History:         Patient has no prior history of Echocardiogram examinations.  Sonographer:     Thedora Louder RDCS,FASE Referring Phys:  [id number] DEBBY CROSLEY Diagnosing  Phys: Cara JONETTA Lovelace MD IMPRESSIONS  1. Left ventricular ejection fraction, by estimation, is 65 to 70%. The left ventricle has normal function. The left ventricle has no regional wall motion abnormalities. Left ventricular diastolic parameters are consistent with Grade I diastolic dysfunction (impaired relaxation).  2. Right ventricular systolic function is normal. The right ventricular size is normal.  3. The mitral valve is normal in structure. No evidence of mitral valve regurgitation.  4. The aortic valve is grossly normal. Aortic valve regurgitation is not visualized. FINDINGS  Left Ventricle: Left ventricular ejection fraction, by estimation, is 65 to 70%. The left ventricle has normal function. The left ventricle has no regional wall motion abnormalities. The left ventricular internal cavity size was normal in size. There is  no left ventricular hypertrophy. Left ventricular diastolic parameters are consistent with Grade I diastolic dysfunction (impaired relaxation). Right Ventricle: The right ventricular size is normal. No increase in right ventricular wall thickness. Right ventricular systolic function is normal. Left Atrium: Left atrial size was normal in size. Right Atrium: Right atrial size was normal in size. Pericardium: There is no evidence of pericardial effusion. Mitral Valve: The mitral valve is normal in structure. No evidence of mitral valve regurgitation. Tricuspid Valve: The tricuspid valve is normal in structure. Tricuspid valve regurgitation is not demonstrated. Aortic Valve: The aortic valve is grossly normal. Aortic valve regurgitation is not visualized. Aortic valve peak gradient measures 9.5 mmHg. Pulmonic Valve: The pulmonic valve was normal in structure. Pulmonic valve regurgitation is not visualized. Aorta: The ascending aorta was not well visualized. IAS/Shunts: No atrial level shunt detected by color flow Doppler.  LEFT VENTRICLE PLAX 2D LVIDd:         4.80 cm   Diastology LVIDs:  3.00 cm   LV e' medial:    7.40 cm/s LV PW:         1.10 cm   LV E/e' medial:  10.7 LV IVS:        1.10 cm   LV e' lateral:   8.70 cm/s LVOT diam:     1.80 cm   LV E/e' lateral: 9.1 LV SV:         76 LV SV Index:   41 LVOT Area:     2.54 cm  RIGHT VENTRICLE RV Basal diam:  2.90 cm RV S prime:     13.40 cm/s LEFT ATRIUM           Index        RIGHT ATRIUM           Index LA diam:      4.00 cm 2.19 cm/m   RA Area:     10.30 cm LA Vol (A2C): 23.1 ml 12.65 ml/m  RA Volume:   21.30 ml  11.66 ml/m LA Vol (A4C): 43.3 ml 23.71 ml/m  AORTIC VALVE                  PULMONIC VALVE AV Area (Vmax): 2.58 cm      RVOT Peak grad: 3 mmHg AV Vmax:        154.00 cm/s AV Peak Grad:   9.5 mmHg LVOT Vmax:      156.00 cm/s LVOT Vmean:     103.000 cm/s LVOT VTI:       0.297 m  AORTA Ao Root diam: 3.10 cm MITRAL VALVE               TRICUSPID VALVE MV Area (PHT): 3.24 cm    TR Peak grad:   20.4 mmHg MV Decel Time: 234 msec    TR Vmax:        226.00 cm/s MV E velocity: 79.10 cm/s MV A velocity: 80.70 cm/s  SHUNTS MV E/A ratio:  0.98        Systemic VTI:  0.30 m                            Systemic Diam: 1.80 cm Dwayne D Callwood MD Electronically signed by Cara JONETTA Lovelace MD Signature Date/Time: 12/10/2023/4:58:25 PM    Final         Scheduled Meds:  Chlorhexidine  Gluconate Cloth  6 each Topical Daily   docusate sodium   100 mg Oral BID   enoxaparin  (LOVENOX ) injection  40 mg Subcutaneous Q24H   feeding supplement  1 Container Oral TID BM   mirtazapine   15 mg Oral QHS   multivitamin with minerals  1 tablet Oral Daily   pantoprazole   40 mg Oral Daily   phosphorus  500 mg Oral Q4H   polyethylene glycol  17 g Oral BID   potassium chloride   40 mEq Oral Once   Continuous Infusions:     LOS: 2 days    Elvan Sor, MD [facility]   If 7PM-7AM, please contact night-coverage  12/11/2023, 3:59 PM

## 2023-12-12 ENCOUNTER — Inpatient Hospital Stay: Payer: Medicare Other | Admitting: Certified Registered"

## 2023-12-12 ENCOUNTER — Encounter: Payer: Self-pay | Admitting: Internal Medicine

## 2023-12-12 ENCOUNTER — Inpatient Hospital Stay: Payer: Medicare Other

## 2023-12-12 ENCOUNTER — Encounter
Admission: EM | Disposition: A | Discharge status: Skilled Nursing Facility | Payer: Self-pay | Source: Home / Self Care | Attending: Student

## 2023-12-12 DIAGNOSIS — A419 Sepsis, unspecified organism: Secondary | ICD-10-CM | POA: Diagnosis not present

## 2023-12-12 DIAGNOSIS — K259 Gastric ulcer, unspecified as acute or chronic, without hemorrhage or perforation: Secondary | ICD-10-CM

## 2023-12-12 DIAGNOSIS — K2980 Duodenitis without bleeding: Secondary | ICD-10-CM

## 2023-12-12 HISTORY — PX: BIOPSY: SHX5522

## 2023-12-12 HISTORY — PX: ESOPHAGOGASTRODUODENOSCOPY (EGD) WITH PROPOFOL: SHX5813

## 2023-12-12 LAB — BASIC METABOLIC PANEL
Anion gap: 9 (ref 5–15)
BUN: 10 mg/dL (ref 8–23)
CO2: 20 mmol/L — ABNORMAL LOW (ref 22–32)
Calcium: 7.8 mg/dL — ABNORMAL LOW (ref 8.9–10.3)
Chloride: 107 mmol/L (ref 98–111)
Creatinine, Ser: 0.52 mg/dL (ref 0.44–1.00)
GFR, Estimated: >60 mL/min (ref >60)
Glucose, Bld: 92 mg/dL (ref 70–99)
Potassium: 3.5 mmol/L (ref 3.5–5.1)
Sodium: 136 mmol/L (ref 135–145)

## 2023-12-12 LAB — CBC
HCT: 32.9 % — ABNORMAL LOW (ref 36.0–46.0)
Hemoglobin: 11.8 g/dL — ABNORMAL LOW (ref 12.0–15.0)
MCH: 31 pg (ref 26.0–34.0)
MCHC: 35.9 g/dL (ref 30.0–36.0)
MCV: 86.4 fL (ref 80.0–100.0)
Platelets: 118 10*3/uL — ABNORMAL LOW (ref 150–400)
RBC: 3.81 MIL/uL — ABNORMAL LOW (ref 3.87–5.11)
RDW: 14.2 % (ref 11.5–15.5)
WBC: 5.2 10*3/uL (ref 4.0–10.5)
nRBC: 0 % (ref 0.0–0.2)

## 2023-12-12 LAB — PHOSPHORUS: Phosphorus: 2.3 mg/dL — ABNORMAL LOW (ref 2.5–4.6)

## 2023-12-12 LAB — MAGNESIUM: Magnesium: 1.8 mg/dL (ref 1.7–2.4)

## 2023-12-12 SURGERY — ESOPHAGOGASTRODUODENOSCOPY (EGD) WITH PROPOFOL
Anesthesia: General

## 2023-12-12 MED ORDER — POTASSIUM PHOSPHATES 15 MMOLE/5ML IV SOLN
30.0000 mmol | Freq: Once | INTRAVENOUS | Status: DC
  Filled 2023-12-12: qty 10

## 2023-12-12 MED ORDER — PANTOPRAZOLE SODIUM 40 MG PO TBEC
40.0000 mg | DELAYED_RELEASE_TABLET | Freq: Two times a day (BID) | ORAL | Status: DC
  Administered 2023-12-12 – 2023-12-14 (×4): 40 mg via ORAL
  Filled 2023-12-12 (×4): qty 1

## 2023-12-12 MED ORDER — BISACODYL 10 MG RE SUPP: 10.0000 mg | Freq: Every day | RECTAL | Status: DC | PRN

## 2023-12-12 MED ORDER — LIDOCAINE HCL (CARDIAC) PF 100 MG/5ML IV SOSY
PREFILLED_SYRINGE | INTRAVENOUS | Status: DC | PRN
  Administered 2023-12-12: 100 mg via INTRAVENOUS

## 2023-12-12 MED ORDER — PROPOFOL 10 MG/ML IV BOLUS
INTRAVENOUS | Status: DC | PRN
  Administered 2023-12-12: 10 mg via INTRAVENOUS
  Administered 2023-12-12: 80 mg via INTRAVENOUS
  Administered 2023-12-12: 10 mg via INTRAVENOUS

## 2023-12-12 MED ORDER — BISACODYL 5 MG PO TBEC
10.0000 mg | DELAYED_RELEASE_TABLET | Freq: Every day | ORAL | Status: DC
  Filled 2023-12-12: qty 2

## 2023-12-12 NOTE — Care Management Important Message (Signed)
 Important Message  Patient Details  Name: Marilyn Castillo MRN: [id number] Date of Birth: 1946/03/20   Important Message Given:  Yes - Medicare IM     Sherilyn Banker 12/12/2023, 2:50 PM

## 2023-12-12 NOTE — Progress Notes (Signed)
 PROGRESS NOTE    Marilyn Castillo  FMW:[id number] DOB: Feb 24, 1946 DOA: 12/08/2023 PCP: Glover Lenis, MD    Brief Narrative:  78 year old female who does not follow regularly with doctors who apparently had difficulty swallowing for the past couple months.  This has been worsening in severity to the point that now she is only drinking water.  Not able to tolerate it does not consume any protein supplements.  Reports weight loss and progressive weakness.  Denies nausea vomiting or diarrhea.  Does endorse constipation.  Denies chest pain.  Was brought to the ER at the urging of her husband.  Apparently patient has not been following up with outpatient physicians and has not been seen by gastroenterology in the past.   While in triage patient suddenly became unresponsive.  No pulse was found.  Blood pressure apparently 36/24.  Not responsive to noxious stimulus.  Patient did have spontaneous recovery.  Did not require endotracheal intubation no chest compressions.   Etiology of dysphagia is unclear.  Patient is a poor historian.  I will engage consultation from SLP to evaluate for oropharyngeal dysphagia.  If this is unrevealing we will consider GI consultation for endoluminal evaluation  1/5: Seen by SLP.  No evidence of oropharyngeal dysphagia.  Diet advanced to regular with thin liquids.  On 1/5 AM I noted the patient to be eating and swallowing without issue with husband at bedside.   Assessment & Plan:   Principal Problem:   Sepsis (HCC) Active Problems:   Episode of unresponsiveness   Hypokalemia   Lactic acidosis   Metabolic acidosis   Fecal impaction (HCC)   Adult failure to thrive   Esophageal dysphagia  # Inability to eat, decreased p.o. intake intake # Adult failure to thrive Patient presented with suppose it inability to eat since October.  Was initially doing Ensure shakes but deteriorated to where she was only consuming water.   Patient was cleared by SLP for regular diet  with thin liquids.  No signs or symptoms of oropharyngeal dysphagia.   Regular diet as tolerated Continue pantoprazole  twice daily  RD consultation for dietary education 1/6 barium swallow study negative for any significant findings GI consulted disable EGD, normal esophagus, did show some duodenitis and antral ulcer.  Continue PPI and repeat EGD in 6 weeks as an outpatient.   # Hypothermia # Lactic acidosis Brief episode of hypotension and unresponsiveness The episode associated with hypotension and unresponsiveness was noted in triage.  Supposedly systolic blood pressure in the 30s.  This resolved spontaneously without intervention.  Very low suspicion for true cardiac arrest.  Seen in consultation by cardiology.  No plans for ischemic evaluation.  Cardiology signed off No evidence of sepsis.  Sepsis ruled out Plan: Stop antibiotics Monitor vitals and fever curve Fall precautions  # Hypokalemia, potassium repleted. # Hypomagnesemia, mag repleted. # Hypophosphatemia, Phos repleted. Monitor and replace as necessary   # Fecal impaction/constipation Resolved Initiated bowel regimen   DVT prophylaxis: Lovenox  Code Status: Full Family Communication: Spouse at bedside 1/7 Disposition Plan: Status is: Inpatient Remains inpatient appropriate because: Adult failure to thrive   Level of care: Med-Surg  Consultants:  Cardio GI  Procedures:  S/p EGD: Duodenitis and antral ulcer, no obstruction/stenosis  Antimicrobials: None   Subjective: No significant events overnight, patient was seen after EGD, tolerated procedure well.  Denied any complaints.    Objective: Vitals:   12/12/23 1156 12/12/23 1205 12/12/23 1216 12/12/23 1548  BP: 99/68 132/84 121/71 129/68  Pulse:  72 66  Resp:   18   Temp:    97.8 F (36.6 C)  TempSrc:      SpO2:    99%  Weight:      Height:        Intake/Output Summary (Last 24 hours) at 12/12/2023 1556 Last data filed at 12/12/2023  1148 Gross per 24 hour  Intake 70 ml  Output 1250 ml  Net -1180 ml   Filed Weights   12/08/23 1748  Weight: 71.4 kg    Examination:  General exam: NAD Respiratory system: Lungs clear.  Normal work of breathing.  Room air Cardiovascular system: S1-S2, RRR, no murmurs, no pedal edema Gastrointestinal system: Soft, NT/ND, hyperactive bowel sounds Central nervous system: Alert and oriented. No focal neurological deficits. Extremities: Symmetric 5 x 5 power. Skin: No rashes, lesions or ulcers Psychiatry: Judgement and insight appear normal. Mood & affect flattened.     Data Reviewed: I have personally reviewed following labs and imaging studies  CBC: Recent Labs  Lab 12/08/23 1704 12/09/23 0638 12/11/23 0914 12/12/23 0439  WBC 6.1 6.4 5.5 5.2  NEUTROABS 3.5  --   --   --   HGB 17.8* 14.4 11.8* 11.8*  HCT 51.8* 41.5 34.2* 32.9*  MCV 86.5 85.4 87.0 86.4  PLT 245 204 125* 118*   Basic Metabolic Panel: Recent Labs  Lab 12/08/23 1704 12/08/23 1942 12/09/23 0638 12/11/23 0914 12/12/23 0439  NA 134*  --  137 141 136  K 3.2*  --  3.2* 2.8* 3.5  CL 94*  --  98 110 107  CO2 18*  --  22 23 20*  GLUCOSE 102*  --  130* 93 92  BUN 26*  --  23 13 10   CREATININE 0.66  --  0.71 0.63 0.52  CALCIUM 9.4  --  8.6* 8.1* 7.8*  MG  --  1.6* 2.4 2.0 1.8  PHOS  --   --   --  1.7* 2.3*   GFR: Estimated Creatinine Clearance: 55.5 mL/min (by C-G formula based on SCr of 0.52 mg/dL). Liver Function Tests: Recent Labs  Lab 12/08/23 1704  AST 19  ALT 13  ALKPHOS 76  BILITOT 2.2*  PROT 7.0  ALBUMIN 3.9   No results for input(s): LIPASE, AMYLASE in the last 168 hours. No results for input(s): AMMONIA in the last 168 hours. Coagulation Profile: Recent Labs  Lab 12/08/23 1704  INR 1.2   Cardiac Enzymes: Recent Labs  Lab 12/08/23 1704  CKTOTAL 17*   BNP (last 3 results) No results for input(s): PROBNP in the last 8760 hours. HbA1C: No results for input(s):  HGBA1C in the last 72 hours. CBG: No results for input(s): GLUCAP in the last 168 hours. Lipid Profile: No results for input(s): CHOL, HDL, LDLCALC, TRIG, CHOLHDL, LDLDIRECT in the last 72 hours. Thyroid Function Tests: No results for input(s): TSH, T4TOTAL, FREET4, T3FREE, THYROIDAB in the last 72 hours. Anemia Panel: No results for input(s): VITAMINB12, FOLATE, FERRITIN, TIBC, IRON, RETICCTPCT in the last 72 hours. Sepsis Labs: Recent Labs  Lab 12/08/23 1708 12/08/23 1942 12/09/23 9361  PROCALCITON  --   --  <0.10  LATICACIDVEN 2.8* 2.2*  --     Recent Results (from the past 240 hours)  Resp panel by RT-PCR (RSV, Flu A&B, Covid) Anterior Nasal Swab     Status: None   Collection Time: 12/08/23  5:17 PM   Specimen: Anterior Nasal Swab  Result Value Ref Range Status   SARS Coronavirus  2 by RT PCR NEGATIVE NEGATIVE Final    Comment: (NOTE) SARS-CoV-2 target nucleic acids are NOT DETECTED.  The SARS-CoV-2 RNA is generally detectable in upper respiratory specimens during the acute phase of infection. The lowest concentration of SARS-CoV-2 viral copies this assay can detect is 138 copies/mL. A negative result does not preclude SARS-Cov-2 infection and should not be used as the sole basis for treatment or other patient management decisions. A negative result may occur with  improper specimen collection/handling, submission of specimen other than nasopharyngeal swab, presence of viral mutation(s) within the areas targeted by this assay, and inadequate number of viral copies(<138 copies/mL). A negative result must be combined with clinical observations, patient history, and epidemiological information. The expected result is Negative.  Fact Sheet for Patients:  bloggercourse.com  Fact Sheet for Healthcare Providers:  seriousbroker.it  This test is no t yet approved or cleared by the [place]  [place] FDA and  has been authorized for detection and/or diagnosis of SARS-CoV-2 by FDA under an Emergency Use Authorization (EUA). This EUA will remain  in effect (meaning this test can be used) for the duration of the COVID-19 declaration under Section 564(b)(1) of the Act, 21 U.S.C.section 360bbb-3(b)(1), unless the authorization is terminated  or revoked sooner.       Influenza A by PCR NEGATIVE NEGATIVE Final   Influenza B by PCR NEGATIVE NEGATIVE Final    Comment: (NOTE) The Xpert Xpress SARS-CoV-2/FLU/RSV plus assay is intended as an aid in the diagnosis of influenza from Nasopharyngeal swab specimens and should not be used as a sole basis for treatment. Nasal washings and aspirates are unacceptable for Xpert Xpress SARS-CoV-2/FLU/RSV testing.  Fact Sheet for Patients: bloggercourse.com  Fact Sheet for Healthcare Providers: seriousbroker.it  This test is not yet approved or cleared by the [place]  FDA and has been authorized for detection and/or diagnosis of SARS-CoV-2 by FDA under an Emergency Use Authorization (EUA). This EUA will remain in effect (meaning this test can be used) for the duration of the COVID-19 declaration under Section 564(b)(1) of the Act, 21 U.S.C. section 360bbb-3(b)(1), unless the authorization is terminated or revoked.     Resp Syncytial Virus by PCR NEGATIVE NEGATIVE Final    Comment: (NOTE) Fact Sheet for Patients: bloggercourse.com  Fact Sheet for Healthcare Providers: seriousbroker.it  This test is not yet approved or cleared by the [place]  FDA and has been authorized for detection and/or diagnosis of SARS-CoV-2 by FDA under an Emergency Use Authorization (EUA). This EUA will remain in effect (meaning this test can be used) for the duration of the COVID-19 declaration under Section 564(b)(1) of the Act, 21 U.S.C. section  360bbb-3(b)(1), unless the authorization is terminated or revoked.  Performed at [facility], [address] [place], [place] [address]   Blood Culture (routine x 2)     Status: None (Preliminary result)   Collection Time: 12/08/23  5:17 PM   Specimen: BLOOD  Result Value Ref Range Status   Specimen Description BLOOD RIGHT ANTECUBITAL  Final   Special Requests   Final    BOTTLES DRAWN AEROBIC AND ANAEROBIC Blood Culture results may not be optimal due to an inadequate volume of blood received in culture bottles   Culture   Final    NO GROWTH 4 DAYS Performed at [facility], [address], [place], [place] [address]    Report Status PENDING  Incomplete  Urine Culture     Status: None   Collection Time: 12/09/23  5:17 AM   Specimen: Urine, Random  Result Value Ref Range Status   Specimen Description   Final    URINE, RANDOM Performed at [facility], [address], [place], [place] [address]    Special Requests   Final    NONE Reflexed from [id number] Performed at [facility], [address], [place], [place] [address]    Culture   Final    NO GROWTH Performed at [facility], [address] [place], [place] [address]    Report Status 12/10/2023 FINAL  Final         Radiology Studies: DG ESOPHAGUS W SINGLE CM (SOL OR THIN BA) Result Date: 12/12/2023 CLINICAL DATA:  78 year old female. Admitted for hypotension found to be septic. Patient endorses dysphagia. Request is for esophagram for further evaluation of possible stricture EXAM: ESOPHAGUS/BARIUM SWALLOW/TABLET STUDY TECHNIQUE: Single contrast examination was performed using thin liquid barium. This exam was performed by Delon Beagle, and was supervised and interpreted by Dr. Marcey Moan FLUOROSCOPY: Radiation Exposure Index (as provided by the fluoroscopic device): 3.30 mGy Kerma COMPARISON:  None Available. FINDINGS: Swallowing: Appears normal. No vestibular  penetration or aspiration seen. Pharynx: Unremarkable. Esophagus: Normal appearance. Esophageal motility: Within normal limits. Hiatal Hernia: None. Gastroesophageal reflux: None visualized. Ingested 13mm barium tablet: Not given Other: None. IMPRESSION: Study was limited due the patient's inability to reposition on exam table or stand. No mass, stricture or significant dysmotility. Electronically Signed   By: Marcey Moan M.D.   On: 12/12/2023 09:37   ECHOCARDIOGRAM COMPLETE Result Date: 12/10/2023    ECHOCARDIOGRAM REPORT   Patient Name:   [facility] Date of Exam: 12/10/2023 Medical Rec #:  [id number]      Height:       67.0 in Accession #:    [id number]     Weight:       157.4 lb Date of Birth:  06-06-1946     BSA:          1.827 m Patient Age:    77 years       BP:           105/42 mmHg Patient Gender: F              HR:           57 bpm. Exam Location:  [facility] Procedure: 2D Echo Indications:     Syncope R55  History:         Patient has no prior history of Echocardiogram examinations.  Sonographer:     Thedora Louder RDCS,FASE Referring Phys:  [id number] DEBBY CROSLEY Diagnosing Phys: Cara JONETTA Lovelace MD IMPRESSIONS  1. Left ventricular ejection fraction, by estimation, is 65 to 70%. The left ventricle has normal function. The left ventricle has no regional wall motion abnormalities. Left ventricular diastolic parameters are consistent with Grade I diastolic dysfunction (impaired relaxation).  2. Right ventricular systolic function is normal. The right ventricular size is normal.  3. The mitral valve is normal in structure. No evidence of mitral valve regurgitation.  4. The aortic valve is grossly normal. Aortic valve regurgitation is not visualized. FINDINGS  Left Ventricle: Left ventricular ejection fraction, by estimation, is 65 to 70%. The left ventricle has normal function. The left ventricle has no regional wall motion abnormalities. The left ventricular internal cavity size was normal in size. There is   no left ventricular hypertrophy. Left ventricular diastolic parameters are consistent with Grade I diastolic dysfunction (impaired relaxation). Right Ventricle:  The right ventricular size is normal. No increase in right ventricular wall thickness. Right ventricular systolic function is normal. Left Atrium: Left atrial size was normal in size. Right Atrium: Right atrial size was normal in size. Pericardium: There is no evidence of pericardial effusion. Mitral Valve: The mitral valve is normal in structure. No evidence of mitral valve regurgitation. Tricuspid Valve: The tricuspid valve is normal in structure. Tricuspid valve regurgitation is not demonstrated. Aortic Valve: The aortic valve is grossly normal. Aortic valve regurgitation is not visualized. Aortic valve peak gradient measures 9.5 mmHg. Pulmonic Valve: The pulmonic valve was normal in structure. Pulmonic valve regurgitation is not visualized. Aorta: The ascending aorta was not well visualized. IAS/Shunts: No atrial level shunt detected by color flow Doppler.  LEFT VENTRICLE PLAX 2D LVIDd:         4.80 cm   Diastology LVIDs:         3.00 cm   LV e' medial:    7.40 cm/s LV PW:         1.10 cm   LV E/e' medial:  10.7 LV IVS:        1.10 cm   LV e' lateral:   8.70 cm/s LVOT diam:     1.80 cm   LV E/e' lateral: 9.1 LV SV:         76 LV SV Index:   41 LVOT Area:     2.54 cm  RIGHT VENTRICLE RV Basal diam:  2.90 cm RV S prime:     13.40 cm/s LEFT ATRIUM           Index        RIGHT ATRIUM           Index LA diam:      4.00 cm 2.19 cm/m   RA Area:     10.30 cm LA Vol (A2C): 23.1 ml 12.65 ml/m  RA Volume:   21.30 ml  11.66 ml/m LA Vol (A4C): 43.3 ml 23.71 ml/m  AORTIC VALVE                  PULMONIC VALVE AV Area (Vmax): 2.58 cm      RVOT Peak grad: 3 mmHg AV Vmax:        154.00 cm/s AV Peak Grad:   9.5 mmHg LVOT Vmax:      156.00 cm/s LVOT Vmean:     103.000 cm/s LVOT VTI:       0.297 m  AORTA Ao Root diam: 3.10 cm MITRAL VALVE               TRICUSPID  VALVE MV Area (PHT): 3.24 cm    TR Peak grad:   20.4 mmHg MV Decel Time: 234 msec    TR Vmax:        226.00 cm/s MV E velocity: 79.10 cm/s MV A velocity: 80.70 cm/s  SHUNTS MV E/A ratio:  0.98        Systemic VTI:  0.30 m                            Systemic Diam: 1.80 cm Dwayne JONETTA Lovelace MD Electronically signed by Cara JONETTA Lovelace MD Signature Date/Time: 12/10/2023/4:58:25 PM    Final         Scheduled Meds:  Chlorhexidine  Gluconate Cloth  6 each Topical Daily   enoxaparin  (LOVENOX ) injection  40 mg Subcutaneous Q24H   feeding supplement  1 Container  Oral TID BM   mirtazapine   15 mg Oral QHS   multivitamin with minerals  1 tablet Oral Daily   pantoprazole   40 mg Oral BID   polyethylene glycol  17 g Oral BID   Continuous Infusions:  0.9 % NaCl with KCl 20 mEq / L 75 mL/hr at 12/12/23 1138   potassium PHOSPHATE  IVPB (in mmol)        LOS: 3 days   Time spent: 35 minutes  Elvan Sor, MD [facility]   If 7PM-7AM, please contact night-coverage  12/12/2023, 3:56 PM

## 2023-12-12 NOTE — Progress Notes (Signed)
 The patient had upper endoscopy today without any source for her dysphagia or nausea vomiting seen.  The procedure did show some duodenitis and some antral ulcers both of which did not cause any obstruction or were explained her issues. I suspect extraintestinal causes for her issues and see if she was unable to complete the upper GI due to inability to reposition on the exam table.  I am not planning any further intervention on this patient at this time.  She can follow-up with Dr. Aundria as an outpatient for any further issues.  I will sign off.  Please call if any further GI concerns or questions.  We would like to thank you for the opportunity to participate in the care of Marilyn Castillo.

## 2023-12-12 NOTE — Plan of Care (Signed)

## 2023-12-12 NOTE — Anesthesia Procedure Notes (Signed)
 Procedure Name: MAC Date/Time: 12/12/2023 11:40 AM  Performed by: Germaine Maeola CROME, CRNAPre-anesthesia Checklist: Patient identified, Emergency Drugs available, Suction available, Patient being monitored and Timeout performed Patient Re-evaluated:Patient Re-evaluated prior to induction Oxygen Delivery Method: Nasal cannula Induction Type: IV induction Placement Confirmation: positive ETCO2 and CO2 detector

## 2023-12-12 NOTE — Evaluation (Signed)
 Occupational Therapy Evaluation Patient Details Name: Marilyn Castillo MRN: [id number] DOB: 13-May-1946 Today's Date: 12/12/2023   History of Present Illness Pt admitted to [facility] on 12/08/23 for c/o decreased PO intake, weakness, dysphagia, and strong urine odor. Pt with episode of syncope and asystole, care team preparing for CPR, then pt exhibited spontaneous respirations. Dx with sepsis with hypothermia and lactic acidosis. Significant PMH includes: hypothyroidism and GERD.Admitted for management of sepsis vs vasovagal episode.   Clinical Impression   Pt was seen for OT evaluation this date. Prior to hospital admission, pt provided limited history to OT, just stating that her husband has been helping her for the last few weeks with ADLs. She had reported to PT that she has been bedbound for the last several months to a year.  Pt presents to acute OT demonstrating impaired ADL performance and functional mobility 2/2 weakness, low activity tolerance, balance deficits (See OT problem list for additional functional deficits). Pt denies pain, noted to have IV out on entry and nurse notified to be fixed after OT session. Pt currently requires Min A for supine to sit at EOB with placement of hands on bed rail and cueing for sequencing. She was able to forward scoot to EOB with SBA and extra time. Pt noted with bowel incontinence and able to stand x2 from EOB with it elevated with Min/Mod A and maintain balance at RW with CGA during peri-care. Total A for peri-care. Pt tired after 1-2 mins standing, then performed SPT from bed to recliner using RW with Min A for walker management during turn. Pt noted to require increased time for processing instructions at times and somewhat confused. Pt would benefit from skilled OT services to address noted impairments and functional limitations (see below for any additional details) in order to maximize safety and independence while minimizing falls risk and caregiver burden. Do  anticipate the need for follow up OT services upon acute hospital DC.        If plan is discharge home, recommend the following: A little help with walking and/or transfers;A lot of help with bathing/dressing/bathroom;Direct supervision/assist for financial management;Direct supervision/assist for medications management;Supervision due to cognitive status;Assist for transportation;Assistance with cooking/housework;Help with stairs or ramp for entrance    Functional Status Assessment  Patient has had a recent decline in their functional status and demonstrates the ability to make significant improvements in function in a reasonable and predictable amount of time.  Equipment Recommendations  Other (comment) (defer to next venue)    Recommendations for Other Services       Precautions / Restrictions Precautions Precautions: Fall Restrictions Weight Bearing Restrictions Per Provider Order: No      Mobility Bed Mobility Overal bed mobility: Needs Assistance Bed Mobility: Supine to Sit     Supine to sit: Min assist, HOB elevated, Used rails     General bed mobility comments: Min A for hand placement on bedrails and cueing for sequencing, then able to perform with increased effort/time    Transfers Overall transfer level: Needs assistance Equipment used: Rolling walker (2 wheels) Transfers: Sit to/from Stand, Bed to chair/wheelchair/BSC Sit to Stand: Min assist, Mod assist, From elevated surface     Step pivot transfers: Contact guard assist, Min assist, From elevated surface     General transfer comment: Min/Mod A from elevated bed surface to RW for OT to perform peri-care then MIN/CGA for SPT to recliner for walker management with turning      Balance Overall balance assessment: Needs assistance Sitting-balance  support: No upper extremity supported, Feet supported Sitting balance-Leahy Scale: Good Sitting balance - Comments: no LOB while seated EOB   Standing balance  support: Bilateral upper extremity supported, Reliant on assistive device for balance Standing balance-Leahy Scale: Poor Standing balance comment: MIN/CGA and dependent on RW for support d/t fear of falling                           ADL either performed or assessed with clinical judgement   ADL Overall ADL's : Needs assistance/impaired                         Toilet Transfer: Contact guard assist;Rolling walker (2 wheels);Minimal assistance Toilet Transfer Details (indicate cue type and reason): Min for walker management, otherwise CGA simulated from bed to recliner Toileting- Clothing Manipulation and Hygiene: Total assistance;Sit to/from stand Toileting - Clothing Manipulation Details (indicate cue type and reason): total A peri-care standing EOB x1-2 mins             Vision         Perception         Praxis         Pertinent Vitals/Pain Pain Assessment Pain Assessment: No/denies pain     Extremity/Trunk Assessment Upper Extremity Assessment Upper Extremity Assessment: Generalized weakness   Lower Extremity Assessment Lower Extremity Assessment: Generalized weakness       Communication Communication Communication: Hearing impairment Cueing Techniques: Verbal cues   Cognition Arousal: Alert Behavior During Therapy: Flat affect Overall Cognitive Status: Difficult to assess                                 General Comments: Oriented to self, location and situation; follows simple commands, but does require increased time for processing/initiation at times or repetition of instruction     General Comments  bowel incontinence upon sitting requiring full change/cleaning    Exercises Other Exercises Other Exercises: Edu on role of OT in acute setting and importance of therapy to maximize safety/IND.   Shoulder Instructions      Home Living Family/patient expects to be discharged to:: Private residence Living Arrangements:  Spouse/significant other Available Help at Discharge: Family Type of Home: House       Home Layout: Two level;Bed/bath upstairs                          Prior Functioning/Environment               Mobility Comments: Per patient, has not been out of bed in several months to a year due to progressive weakness (and inability to eat) ADLs Comments: reports her husband has been helping with ADLs for the last few weeks?        OT Problem List: Decreased strength;Decreased activity tolerance;Impaired balance (sitting and/or standing)      OT Treatment/Interventions: Self-care/ADL training;Therapeutic exercise;Therapeutic activities;DME and/or AE instruction;Patient/family education;Balance training    OT Goals(Current goals can be found in the care plan section) Acute Rehab OT Goals Patient Stated Goal: improve strength and reduce fear of falling OT Goal Formulation: With patient Time For Goal Achievement: 12/26/23 Potential to Achieve Goals: Fair ADL Goals Pt Will Perform Grooming: sitting;with set-up Pt Will Perform Lower Body Bathing: with contact guard assist;sitting/lateral leans;sit to/from stand Pt Will Perform Lower Body Dressing: with contact guard  assist;sit to/from stand;sitting/lateral leans Pt Will Transfer to Toilet: with contact guard assist;ambulating;regular height toilet;grab bars Pt Will Perform Toileting - Clothing Manipulation and hygiene: with contact guard assist;sit to/from stand;sitting/lateral leans Additional ADL Goal #1: Pt will demo bed mobility with SUP and good safety to return to PLOF.  OT Frequency: Min 1X/week    Co-evaluation              AM-PAC OT 6 Clicks Daily Activity     Outcome Measure Help from another person eating meals?: A Little Help from another person taking care of personal grooming?: A Little Help from another person toileting, which includes using toliet, bedpan, or urinal?: A Lot Help from another person  bathing (including washing, rinsing, drying)?: A Lot Help from another person to put on and taking off regular upper body clothing?: A Little Help from another person to put on and taking off regular lower body clothing?: A Lot 6 Click Score: 15   End of Session Equipment Utilized During Treatment: Rolling walker (2 wheels) Nurse Communication: Mobility status  Activity Tolerance: Patient tolerated treatment well Patient left: in chair;with call bell/phone within reach;with nursing/sitter in room  OT Visit Diagnosis: Unsteadiness on feet (R26.81);Other abnormalities of gait and mobility (R26.89);Muscle weakness (generalized) (M62.81)                Time: 8596-8573 OT Time Calculation (min): 23 min Charges:  OT General Charges $OT Visit: 1 Visit OT Evaluation $OT Eval Moderate Complexity: 1 Mod OT Treatments $Self Care/Home Management : 8-22 mins  Aaisha Sliter, OTR/L 12/12/23, 3:11 PM Airi Copado E Grae Leathers 12/12/2023, 3:04 PM

## 2023-12-12 NOTE — Anesthesia Preprocedure Evaluation (Addendum)
 Anesthesia Evaluation  Patient identified by MRN, date of birth, ID band Patient awake    Reviewed: Allergy & Precautions, NPO status , Patient's Chart, lab work & pertinent test results  History of Anesthesia Complications Negative for: history of anesthetic complications  Airway Mallampati: II   Neck ROM: Full    Dental  (+) Missing, Poor Dentition   Pulmonary neg pulmonary ROS   Pulmonary exam normal breath sounds clear to auscultation       Cardiovascular Normal cardiovascular exam Rhythm:Regular Rate:Normal  ECG 12/08/23:  Sinus rhythm Minimal ST depression, anterolateral leads Baseline wander in lead(s) V4 V6   Neuro/Psych negative neurological ROS     GI/Hepatic ,GERD  ,,  Endo/Other  Hypothyroidism    Renal/GU negative Renal ROS     Musculoskeletal   Abdominal   Peds  Hematology negative hematology ROS (+)   Anesthesia Other Findings Cardiology note 12/11/23:  ASSESSMENT AND PLAN: Lynniah Janoski is a 78 y.o. female with a past medical history of GERD, hypothyroidism who presented to the ED on 12/08/2023 for weakness and dysphagia.  Concern for sepsis initially, patient had unresponsive episode in the ED with significantly low BP and possible pulselessness but shortly after regained consciousness spontaneously.  Cardiology was consulted for further evaluation.   # Sepsis # Episode of unresponsiveness Patient initially presented to ED for weakness and dysphagia, had episode of unresponsiveness in the ED.  Was noted to be hypotensive in the ED with lactic acidosis, treated for sepsis.  Echo this admission with preserved EF and no valvular abnormalities.  No significant episodes of bradycardia arrhythmias on telemetry prior to this being discontinued. -Workup unrevealing thus far for cause of unresponsive episode. -No further cardiac procedures planned/recommended at this time. -Further management of sepsis and  dysphagia per primary.   Cardiology will sign off. Please haiku with questions or re-engage if needed.    Reproductive/Obstetrics                             Anesthesia Physical Anesthesia Plan  ASA: 2  Anesthesia Plan: General   Post-op Pain Management:    Induction: Intravenous  PONV Risk Score and Plan: 3 and Propofol  infusion, TIVA and Treatment may vary due to age or medical condition  Airway Management Planned: Natural Airway  Additional Equipment:   Intra-op Plan:   Post-operative Plan:   Informed Consent: I have reviewed the patients History and Physical, chart, labs and discussed the procedure including the risks, benefits and alternatives for the proposed anesthesia with the patient or authorized representative who has indicated his/her understanding and acceptance.       Plan Discussed with: CRNA  Anesthesia Plan Comments: (LMA/GETA backup discussed.  Patient consented for risks of anesthesia including but not limited to:  - adverse reactions to medications - damage to eyes, teeth, lips or other oral mucosa - nerve damage due to positioning  - sore throat or hoarseness - damage to heart, brain, nerves, lungs, other parts of body or loss of life  Informed patient about role of CRNA in peri- and intra-operative care.  Patient voiced understanding.)        Anesthesia Quick Evaluation

## 2023-12-12 NOTE — Op Note (Signed)
 [facility] Gastroenterology Patient Name: Marilyn Castillo Procedure Date: 12/12/2023 11:32 AM MRN: [id number] Account #: [id number] Date of Birth: [birth year] Admit Type: Inpatient Age: 78 Room: [facility] ENDO ROOM 4 Gender: Female Note Status: Finalized Instrument Name: Barnie Endoscope [id number] Procedure:             Upper GI endoscopy Indications:           Dysphagia, Nausea with vomiting Providers:             Rogelia Copping MD, MD Referring MD:          Alm HERO. Glover, MD (Referring MD) Medicines:             Propofol  per Anesthesia Complications:         No immediate complications. Procedure:             Pre-Anesthesia Assessment:                        - Prior to the procedure, a History and Physical was                         performed, and patient medications and allergies were                         reviewed. The patient's tolerance of previous                         anesthesia was also reviewed. The risks and benefits                         of the procedure and the sedation options and risks                         were discussed with the patient. All questions were                         answered, and informed consent was obtained. Prior                         Anticoagulants: The patient has taken no anticoagulant                         or antiplatelet agents. ASA Grade Assessment: II - A                         patient with mild systemic disease. After reviewing                         the risks and benefits, the patient was deemed in                         satisfactory condition to undergo the procedure.                        After obtaining informed consent, the endoscope was                         passed under direct vision. Throughout the procedure,  the patient's blood pressure, pulse, and oxygen                         saturations were monitored continuously. The Endoscope                         was introduced through  the mouth, and advanced to the                         second part of duodenum. The upper GI endoscopy was                         accomplished without difficulty. The patient tolerated                         the procedure well. Findings:      The examined esophagus was normal.      Two non-bleeding cratered gastric ulcers with no stigmata of bleeding       were found in the gastric antrum.      Moderately erythematous mucosa without active bleeding and with no       stigmata of bleeding was found in the duodenal bulb. Biopsies were taken       with a cold forceps for histology. Impression:            - Normal esophagus.                        - Non-bleeding gastric ulcers with no stigmata of                         bleeding.                        - Erythematous duodenopathy. Biopsied. Recommendation:        - Return patient to hospital ward for ongoing care.                        - Resume previous diet.                        - Continue present medications.                        - Await pathology results.                        - Repeat upper endoscopy in 6 weeks to check healing.                        - Use a proton pump inhibitor PO daily. Procedure Code(s):     --- Professional ---                        [phone], Esophagogastroduodenoscopy, flexible,                         transoral; with biopsy, single or multiple Diagnosis Code(s):     --- Professional ---  R13.10, Dysphagia, unspecified                        R11.2, Nausea with vomiting, unspecified                        K25.9, Gastric ulcer, unspecified as acute or chronic,                         without hemorrhage or perforation CPT copyright 2022 American Medical Association. All rights reserved. The codes documented in this report are preliminary and upon coder review may  be revised to meet current compliance requirements. Rogelia Copping MD, MD 12/12/2023 11:49:53 AM This report has been signed  electronically. Number of Addenda: 0 Note Initiated On: 12/12/2023 11:32 AM Estimated Blood Loss:  Estimated blood loss: none.      [facility]

## 2023-12-12 NOTE — Progress Notes (Signed)
 Mobility Specialist - Progress Note   12/12/23 1519  Mobility  Activity Transferred from chair to bed  Level of Assistance Minimal assist, patient does 75% or more  Assistive Device Front wheel walker  Distance Ambulated (ft) 2 ft  Activity Response Tolerated well  Mobility visit 1 Mobility     Pt sitting in recliner upon arrival, utilizing RA. Pt requesting return to bed. Reports being really fatigue. Pt able to come into partial standing with modA before plopping back into chair and voices increased comfort with having 2nd assist. MinA +2 to stand and pivot to bed. Assist on LE to return supine. Pt left in bed with alarm set, needs in reach. Family at bedside.    Lennette Seip Mobility Specialist 12/12/23, 3:23 PM

## 2023-12-12 NOTE — Progress Notes (Signed)
 77yof admitted 12/08/23 dysphagia, weakness, wt loss. Pt pending EGD 12/12/23 morning. Pt NPO status midnight and compliant. Consent completed by morning nurse and placed in patients charts. Pt able to rest overnight with no acute change in status.

## 2023-12-12 NOTE — Progress Notes (Signed)
 OT Cancellation Note  Patient Details Name: Marilyn Castillo MRN: [id number] DOB: 05/14/1946   Cancelled Treatment:    Reason Eval/Treat Not Completed: Patient at procedure or test/ unavailable. Pt in room for 1st attempt, but declining stating she needed to try to rest. Upon return for 2nd attempt pt was off the floor at endoscopy. Will re-attempt at later time today if time allows/pt available.  Jannelly Bergren E Courtland Reas 12/12/2023, 12:16 PM

## 2023-12-12 NOTE — Transfer of Care (Signed)
 Immediate Anesthesia Transfer of Care Note  Patient: Marilyn Castillo  Procedure(s) Performed: ESOPHAGOGASTRODUODENOSCOPY (EGD) WITH PROPOFOL  BIOPSY  Patient Location: PACU and Endoscopy Unit  Anesthesia Type:General  Level of Consciousness: drowsy  Airway & Oxygen Therapy: Patient Spontanous Breathing and Patient connected to nasal cannula oxygen  Post-op Assessment: Report given to RN and Post -op Vital signs reviewed and stable  Post vital signs: Reviewed and stable  Last Vitals:  Vitals Value Taken Time  BP 99/68 12/12/23 1156  Temp 36 C 12/12/23 1151  Pulse 71 12/12/23 1156  Resp 22 12/12/23 1156  SpO2 95 % 12/12/23 1156  Vitals shown include unfiled device data.  Last Pain:  Vitals:   12/12/23 1151  TempSrc: Temporal  PainSc: Asleep         Complications: No notable events documented.

## 2023-12-12 NOTE — Progress Notes (Signed)
 PT Cancellation Note  Patient Details Name: Marilyn Castillo MRN: [id number] DOB: [birth year]   Cancelled Treatment:    Reason Eval/Treat Not Completed: Patient at procedure or test/unavailable (Treatment session attempted. Patient currently off unit for diagnostic testing.  Will re-attempt at later time/date as medically appropriate and available.)  Theus Espin H. Delores, PT, DPT, NCS 12/12/23, 11:44 AM [phone]

## 2023-12-13 ENCOUNTER — Encounter: Payer: Self-pay | Admitting: Gastroenterology

## 2023-12-13 DIAGNOSIS — A419 Sepsis, unspecified organism: Secondary | ICD-10-CM | POA: Diagnosis not present

## 2023-12-13 LAB — BASIC METABOLIC PANEL
Anion gap: 12 (ref 5–15)
BUN: 8 mg/dL (ref 8–23)
CO2: 22 mmol/L (ref 22–32)
Calcium: 8.5 mg/dL — ABNORMAL LOW (ref 8.9–10.3)
Chloride: 106 mmol/L (ref 98–111)
Creatinine, Ser: 0.45 mg/dL (ref 0.44–1.00)
GFR, Estimated: >60 mL/min (ref >60)
Glucose, Bld: 77 mg/dL (ref 70–99)
Potassium: 3.3 mmol/L — ABNORMAL LOW (ref 3.5–5.1)
Sodium: 140 mmol/L (ref 135–145)

## 2023-12-13 LAB — CBC
HCT: 39.9 % (ref 36.0–46.0)
Hemoglobin: 13.6 g/dL (ref 12.0–15.0)
MCH: 29.6 pg (ref 26.0–34.0)
MCHC: 34.1 g/dL (ref 30.0–36.0)
MCV: 86.9 fL (ref 80.0–100.0)
Platelets: 133 10*3/uL — ABNORMAL LOW (ref 150–400)
RBC: 4.59 MIL/uL (ref 3.87–5.11)
RDW: 14.1 % (ref 11.5–15.5)
WBC: 4.9 10*3/uL (ref 4.0–10.5)
nRBC: 0 % (ref 0.0–0.2)

## 2023-12-13 LAB — MAGNESIUM: Magnesium: 1.8 mg/dL (ref 1.7–2.4)

## 2023-12-13 LAB — CULTURE, BLOOD (ROUTINE X 2): Culture: NO GROWTH

## 2023-12-13 LAB — SURGICAL PATHOLOGY

## 2023-12-13 LAB — PHOSPHORUS: Phosphorus: 1.6 mg/dL — ABNORMAL LOW (ref 2.5–4.6)

## 2023-12-13 MED ORDER — POTASSIUM PHOSPHATES 15 MMOLE/5ML IV SOLN: 30.0000 mmol | Freq: Once | INTRAVENOUS | Status: DC

## 2023-12-13 MED ORDER — ORAL CARE MOUTH RINSE: 15.0000 mL | OROMUCOSAL | Status: DC | PRN

## 2023-12-13 MED ORDER — POTASSIUM PHOSPHATES 15 MMOLE/5ML IV SOLN
30.0000 mmol | Freq: Once | INTRAVENOUS | Status: AC
  Administered 2023-12-13: 30 mmol via INTRAVENOUS
  Filled 2023-12-13: qty 10

## 2023-12-13 NOTE — Anesthesia Postprocedure Evaluation (Signed)
 Anesthesia Post Note  Patient: Marilyn Castillo  Procedure(s) Performed: ESOPHAGOGASTRODUODENOSCOPY (EGD) WITH PROPOFOL  BIOPSY  Patient location during evaluation: PACU Anesthesia Type: General Level of consciousness: awake and alert, oriented and patient cooperative Pain management: pain level controlled Vital Signs Assessment: post-procedure vital signs reviewed and stable Respiratory status: spontaneous breathing, nonlabored ventilation and respiratory function stable Cardiovascular status: blood pressure returned to baseline and stable Postop Assessment: adequate PO intake Anesthetic complications: no   No notable events documented.   Last Vitals:  Vitals:   12/13/23 0228 12/13/23 0807  BP: (!) 146/69 128/86  Pulse: 64 89  Resp: 20   Temp: (!) 36.4 C 36.6 C  SpO2: 99% 98%    Last Pain:  Vitals:   12/13/23 0228  TempSrc: Oral  PainSc:                  Baldemar Dady

## 2023-12-13 NOTE — Progress Notes (Signed)
 Patient was encouraged to eat. I assisted her in sitting upright but she still would not try to eat

## 2023-12-13 NOTE — Plan of Care (Signed)

## 2023-12-13 NOTE — Progress Notes (Signed)
 Physical Therapy Treatment Patient Details Name: Marilyn Castillo MRN: [id number] DOB: [birth year] Today's Date: 12/13/2023   History of Present Illness Pt admitted to [facility] on 12/08/23 for c/o decreased PO intake, weakness, dysphagia, and strong urine odor. Pt with episode of syncope and asystole, care team preparing for CPR, then pt exhibited spontaneous respirations. Dx with sepsis with hypothermia and lactic acidosis. Significant PMH includes: hypothyroidism and GERD.Admitted for management of sepsis vs vasovagal episode.    PT Comments  Patient agreeable to participation with session; denies pain.  Able to complete OOB to chair and initiate short-distance gait training with RW, min/mod assist +2 for safety.  Demonstrates very slow, shuffling gait pattern with poor balance, poor overall activity tolerance.  Quick to initiate sitting once fatigued; mod/max cuing to maintain standing until positioned for safe return to sit.    If plan is discharge home, recommend the following: A lot of help with walking and/or transfers;A lot of help with bathing/dressing/bathroom   Can travel by private vehicle     Yes  Equipment Recommendations       Recommendations for Other Services       Precautions / Restrictions Precautions Precautions: Fall Restrictions Weight Bearing Restrictions Per Provider Order: No     Mobility  Bed Mobility Overal bed mobility: Needs Assistance Bed Mobility: Supine to Sit     Supine to sit: Min assist          Transfers Overall transfer level: Needs assistance Equipment used: Rolling walker (2 wheels) Transfers: Sit to/from Stand, Bed to chair/wheelchair/BSC Sit to Stand: Min assist, Mod assist Stand pivot transfers: Min assist, Mod assist              Ambulation/Gait Ambulation/Gait assistance: Min assist, +2 safety/equipment Gait Distance (Feet): 10 Feet Assistive device: Rolling walker (2 wheels)         General Gait Details: shuffling steps  with flat foot contact throughout gait cycle; poor balance reactions.  Quick to sit once fatigued, requiring cuing to maintain standing (and control descent) until chair is positioned and ready.  Unsafe to attempt without RW and +1 assist   Stairs             Wheelchair Mobility     Tilt Bed    Modified Rankin (Stroke Patients Only)       Balance Overall balance assessment: Needs assistance Sitting-balance support: No upper extremity supported, Feet supported Sitting balance-Leahy Scale: Good     Standing balance support: Bilateral upper extremity supported Standing balance-Leahy Scale: Poor                              Cognition Arousal: Alert Behavior During Therapy: Flat affect Overall Cognitive Status: Difficult to assess                                 General Comments: Oriented to self, location and situation; follows simple commands, but does require increased time for processing/initiation at times or repetition of instruction.  Generally anxious        Exercises      General Comments        Pertinent Vitals/Pain Pain Assessment Pain Assessment: No/denies pain    Home Living                          Prior Function  PT Goals (current goals can now be found in the care plan section) Acute Rehab PT Goals Patient Stated Goal: to return home PT Goal Formulation: With patient Time For Goal Achievement: 12/24/23 Potential to Achieve Goals: Good Progress towards PT goals: Progressing toward goals    Frequency    Min 1X/week      PT Plan      Co-evaluation              AM-PAC PT 6 Clicks Mobility   Outcome Measure  Help needed turning from your back to your side while in a flat bed without using bedrails?: A Little Help needed moving from lying on your back to sitting on the side of a flat bed without using bedrails?: A Little Help needed moving to and from a bed to a chair  (including a wheelchair)?: A Lot Help needed standing up from a chair using your arms (e.g., wheelchair or bedside chair)?: A Lot Help needed to walk in hospital room?: A Lot Help needed climbing 3-5 steps with a railing? : A Lot 6 Click Score: 14    End of Session Equipment Utilized During Treatment: Gait belt Activity Tolerance: Patient tolerated treatment well Patient left: in chair;with call bell/phone within reach;with chair alarm set;with family/visitor present Nurse Communication: Mobility status PT Visit Diagnosis: Muscle weakness (generalized) (M62.81);Difficulty in walking, not elsewhere classified (R26.2)     Time: 9043-8982 PT Time Calculation (min) (ACUTE ONLY): 21 min  Charges:    $Therapeutic Activity: 8-22 mins PT General Charges $$ ACUTE PT VISIT: 1 Visit                    Dimitri Dsouza H. Delores, PT, DPT, NCS 12/13/23, 4:43 PM [phone]

## 2023-12-13 NOTE — Progress Notes (Signed)
 Mobility Specialist - Progress Note   12/13/23 1053  Mobility  Activity Transferred from chair to bed  Level of Assistance Minimal assist, patient does 75% or more  Assistive Device Front wheel walker  Distance Ambulated (ft) 4 ft  Activity Response Tolerated well  Mobility visit 1 Mobility  Mobility Specialist Start Time (ACUTE ONLY) 1046  Mobility Specialist Stop Time (ACUTE ONLY) 1050  Mobility Specialist Time Calculation (min) (ACUTE ONLY) 4 min   Pt sitting in the recliner upon entry, utilizing RA. Pt requesting to transfer to bed. Pt STS to RW MinA +2 and transfer to bed MinA (second hand present but not utilized). Pt left supine with alarm set and needs within reach.  America Silvan Mobility Specialist 12/13/23 11:01 AM

## 2023-12-13 NOTE — Plan of Care (Signed)

## 2023-12-13 NOTE — Progress Notes (Signed)
 PROGRESS NOTE    Marilyn Castillo  FMW:[id number] DOB: 08/17/1946 DOA: 12/08/2023 PCP: Glover Lenis, MD    Brief Narrative:  78 year old female who does not follow regularly with doctors who apparently had difficulty swallowing for the past couple months.  This has been worsening in severity to the point that now she is only drinking water.  Not able to tolerate it does not consume any protein supplements.  Reports weight loss and progressive weakness.  Denies nausea vomiting or diarrhea.  Does endorse constipation.  Denies chest pain.  Was brought to the ER at the urging of her husband.  Apparently patient has not been following up with outpatient physicians and has not been seen by gastroenterology in the past.   While in triage patient suddenly became unresponsive.  No pulse was found.  Blood pressure apparently 36/24.  Not responsive to noxious stimulus.  Patient did have spontaneous recovery.  Did not require endotracheal intubation no chest compressions.   Etiology of dysphagia is unclear.  Patient is a poor historian.  I will engage consultation from SLP to evaluate for oropharyngeal dysphagia.  If this is unrevealing we will consider GI consultation for endoluminal evaluation  1/5: Seen by SLP.  No evidence of oropharyngeal dysphagia.  Diet advanced to regular with thin liquids.  On 1/5 AM I noted the patient to be eating and swallowing without issue with husband at bedside.   Assessment & Plan:   Principal Problem:   Sepsis (HCC) Active Problems:   Episode of unresponsiveness   Hypokalemia   Lactic acidosis   Metabolic acidosis   Fecal impaction (HCC)   Adult failure to thrive   Esophageal dysphagia  # Inability to eat, decreased p.o. intake intake # Adult failure to thrive Patient presented with suppose it inability to eat since October.  Was initially doing Ensure shakes but deteriorated to where she was only consuming water.   Patient was cleared by SLP for regular diet  with thin liquids.  No signs or symptoms of oropharyngeal dysphagia.   Regular diet as tolerated Continue pantoprazole  twice daily  RD consultation for dietary education 1/6 barium swallow study negative for any significant findings GI consulted disable EGD, normal esophagus, did show some duodenitis and antral ulcer.  Continue PPI and repeat EGD in 6 weeks as an outpatient.   # Hypothermia # Lactic acidosis Brief episode of hypotension and unresponsiveness The episode associated with hypotension and unresponsiveness was noted in triage.  Supposedly systolic blood pressure in the 30s.  This resolved spontaneously without intervention.  Very low suspicion for true cardiac arrest.  Seen in consultation by cardiology.  No plans for ischemic evaluation.  Cardiology signed off No evidence of sepsis.  Sepsis ruled out Plan: Stop antibiotics Monitor vitals and fever curve Fall precautions  # Hypokalemia, potassium repleted. # Hypomagnesemia, mag repleted. # Hypophosphatemia, Phos repleted. Monitor and replace as necessary   # Fecal impaction/constipation Constipation resolved, continue laxatives    DVT prophylaxis: Lovenox  Code Status: Full Family Communication: Spouse at bedside 1/8 Disposition Plan: Status is: Inpatient Remains inpatient appropriate because: Adult failure to thrive Discharge to SNF when bed will be available. Follow TOC for disposition plan  Level of care: Med-Surg  Consultants:  Cardio GI  Procedures:  S/p EGD: Duodenitis and antral ulcer, no obstruction/stenosis  Antimicrobials: None   Subjective: No significant events overnight, patient was lying calmly, stated that she feels improvement in her appetite and eating more than usual, denied any nausea vomiting.  No  pain.   Objective: Vitals:   12/12/23 1548 12/12/23 1932 12/13/23 0228 12/13/23 0807  BP: 129/68 (!) 150/74 (!) 146/69 128/86  Pulse: 66 70 64 89  Resp:  20 20   Temp: 97.8 F (36.6  C) 97.6 F (36.4 C) (!) 97.5 F (36.4 C) 97.8 F (36.6 C)  TempSrc: Oral Oral Oral   SpO2: 99% 100% 99% 98%  Weight:      Height:        Intake/Output Summary (Last 24 hours) at 12/13/2023 1417 Last data filed at 12/12/2023 2206 Gross per 24 hour  Intake 0 ml  Output 1100 ml  Net -1100 ml   Filed Weights   12/08/23 1748  Weight: 71.4 kg    Examination:  General exam: NAD Respiratory system: Lungs clear.  Normal work of breathing.  Room air Cardiovascular system: S1-S2, RRR, no murmurs, no pedal edema Gastrointestinal system: Soft, NT/ND, hyperactive bowel sounds Central nervous system: Alert and oriented. No focal neurological deficits. Extremities: Symmetric 5 x 5 power. Skin: No rashes, lesions or ulcers Psychiatry: Judgement and insight appear normal. Mood & affect flattened.     Data Reviewed: I have personally reviewed following labs and imaging studies  CBC: Recent Labs  Lab 12/08/23 1704 12/09/23 0638 12/11/23 0914 12/12/23 0439 12/13/23 0458  WBC 6.1 6.4 5.5 5.2 4.9  NEUTROABS 3.5  --   --   --   --   HGB 17.8* 14.4 11.8* 11.8* 13.6  HCT 51.8* 41.5 34.2* 32.9* 39.9  MCV 86.5 85.4 87.0 86.4 86.9  PLT 245 204 125* 118* 133*   Basic Metabolic Panel: Recent Labs  Lab 12/08/23 1704 12/08/23 1942 12/09/23 0638 12/11/23 0914 12/12/23 0439 12/13/23 0458  NA 134*  --  137 141 136 140  K 3.2*  --  3.2* 2.8* 3.5 3.3*  CL 94*  --  98 110 107 106  CO2 18*  --  22 23 20* 22  GLUCOSE 102*  --  130* 93 92 77  BUN 26*  --  23 13 10 8   CREATININE 0.66  --  0.71 0.63 0.52 0.45  CALCIUM 9.4  --  8.6* 8.1* 7.8* 8.5*  MG  --  1.6* 2.4 2.0 1.8 1.8  PHOS  --   --   --  1.7* 2.3* 1.6*   GFR: Estimated Creatinine Clearance: 55.5 mL/min (by C-G formula based on SCr of 0.45 mg/dL). Liver Function Tests: Recent Labs  Lab 12/08/23 1704  AST 19  ALT 13  ALKPHOS 76  BILITOT 2.2*  PROT 7.0  ALBUMIN 3.9   No results for input(s): LIPASE, AMYLASE in the last  168 hours. No results for input(s): AMMONIA in the last 168 hours. Coagulation Profile: Recent Labs  Lab 12/08/23 1704  INR 1.2   Cardiac Enzymes: Recent Labs  Lab 12/08/23 1704  CKTOTAL 17*   BNP (last 3 results) No results for input(s): PROBNP in the last 8760 hours. HbA1C: No results for input(s): HGBA1C in the last 72 hours. CBG: No results for input(s): GLUCAP in the last 168 hours. Lipid Profile: No results for input(s): CHOL, HDL, LDLCALC, TRIG, CHOLHDL, LDLDIRECT in the last 72 hours. Thyroid Function Tests: No results for input(s): TSH, T4TOTAL, FREET4, T3FREE, THYROIDAB in the last 72 hours. Anemia Panel: No results for input(s): VITAMINB12, FOLATE, FERRITIN, TIBC, IRON, RETICCTPCT in the last 72 hours. Sepsis Labs: Recent Labs  Lab 12/08/23 1708 12/08/23 1942 12/09/23 9361  PROCALCITON  --   --  <0.10  LATICACIDVEN 2.8* 2.2*  --     Recent Results (from the past 240 hours)  Resp panel by RT-PCR (RSV, Flu A&B, Covid) Anterior Nasal Swab     Status: None   Collection Time: 12/08/23  5:17 PM   Specimen: Anterior Nasal Swab  Result Value Ref Range Status   SARS Coronavirus 2 by RT PCR NEGATIVE NEGATIVE Final    Comment: (NOTE) SARS-CoV-2 target nucleic acids are NOT DETECTED.  The SARS-CoV-2 RNA is generally detectable in upper respiratory specimens during the acute phase of infection. The lowest concentration of SARS-CoV-2 viral copies this assay can detect is 138 copies/mL. A negative result does not preclude SARS-Cov-2 infection and should not be used as the sole basis for treatment or other patient management decisions. A negative result may occur with  improper specimen collection/handling, submission of specimen other than nasopharyngeal swab, presence of viral mutation(s) within the areas targeted by this assay, and inadequate number of viral copies(<138 copies/mL). A negative result must be combined  with clinical observations, patient history, and epidemiological information. The expected result is Negative.  Fact Sheet for Patients:  bloggercourse.com  Fact Sheet for Healthcare Providers:  seriousbroker.it  This test is no t yet approved or cleared by the [place]  FDA and  has been authorized for detection and/or diagnosis of SARS-CoV-2 by FDA under an Emergency Use Authorization (EUA). This EUA will remain  in effect (meaning this test can be used) for the duration of the COVID-19 declaration under Section 564(b)(1) of the Act, 21 U.S.C.section 360bbb-3(b)(1), unless the authorization is terminated  or revoked sooner.       Influenza A by PCR NEGATIVE NEGATIVE Final   Influenza B by PCR NEGATIVE NEGATIVE Final    Comment: (NOTE) The Xpert Xpress SARS-CoV-2/FLU/RSV plus assay is intended as an aid in the diagnosis of influenza from Nasopharyngeal swab specimens and should not be used as a sole basis for treatment. Nasal washings and aspirates are unacceptable for Xpert Xpress SARS-CoV-2/FLU/RSV testing.  Fact Sheet for Patients: bloggercourse.com  Fact Sheet for Healthcare Providers: seriousbroker.it  This test is not yet approved or cleared by the [place]  FDA and has been authorized for detection and/or diagnosis of SARS-CoV-2 by FDA under an Emergency Use Authorization (EUA). This EUA will remain in effect (meaning this test can be used) for the duration of the COVID-19 declaration under Section 564(b)(1) of the Act, 21 U.S.C. section 360bbb-3(b)(1), unless the authorization is terminated or revoked.     Resp Syncytial Virus by PCR NEGATIVE NEGATIVE Final    Comment: (NOTE) Fact Sheet for Patients: bloggercourse.com  Fact Sheet for Healthcare Providers: seriousbroker.it  This test is not yet approved  or cleared by the [place]  FDA and has been authorized for detection and/or diagnosis of SARS-CoV-2 by FDA under an Emergency Use Authorization (EUA). This EUA will remain in effect (meaning this test can be used) for the duration of the COVID-19 declaration under Section 564(b)(1) of the Act, 21 U.S.C. section 360bbb-3(b)(1), unless the authorization is terminated or revoked.  Performed at [facility], [address] [place], [place] [address]   Blood Culture (routine x 2)     Status: None   Collection Time: 12/08/23  5:17 PM   Specimen: BLOOD  Result Value Ref Range Status   Specimen Description BLOOD RIGHT ANTECUBITAL  Final   Special Requests   Final    BOTTLES DRAWN AEROBIC AND ANAEROBIC Blood Culture results may not be optimal due to an  inadequate volume of blood received in culture bottles   Culture   Final    NO GROWTH 5 DAYS Performed at [facility], [address] [place], [place] [address]    Report Status 12/13/2023 FINAL  Final  Urine Culture     Status: None   Collection Time: 12/09/23  5:17 AM   Specimen: Urine, Random  Result Value Ref Range Status   Specimen Description   Final    URINE, RANDOM Performed at [facility], [address], [place], [place] [address]    Special Requests   Final    NONE Reflexed from [id number] Performed at [facility], [address], [place], [place] [address]    Culture   Final    NO GROWTH Performed at [facility], [address], [place], [place] [address]    Report Status 12/10/2023 FINAL  Final         Radiology Studies: DG ESOPHAGUS W SINGLE CM (SOL OR THIN BA) Result Date: 12/12/2023 CLINICAL DATA:  78 year old female. Admitted for hypotension found to be septic. Patient endorses dysphagia. Request is for esophagram for further evaluation of possible stricture EXAM: ESOPHAGUS/BARIUM SWALLOW/TABLET STUDY TECHNIQUE: Single contrast examination was performed using  thin liquid barium. This exam was performed by Delon Beagle, and was supervised and interpreted by Dr. Marcey Moan FLUOROSCOPY: Radiation Exposure Index (as provided by the fluoroscopic device): 3.30 mGy Kerma COMPARISON:  None Available. FINDINGS: Swallowing: Appears normal. No vestibular penetration or aspiration seen. Pharynx: Unremarkable. Esophagus: Normal appearance. Esophageal motility: Within normal limits. Hiatal Hernia: None. Gastroesophageal reflux: None visualized. Ingested 13mm barium tablet: Not given Other: None. IMPRESSION: Study was limited due the patient's inability to reposition on exam table or stand. No mass, stricture or significant dysmotility. Electronically Signed   By: Marcey Moan M.D.   On: 12/12/2023 09:37        Scheduled Meds:  bisacodyl   10 mg Oral QHS   enoxaparin  (LOVENOX ) injection  40 mg Subcutaneous Q24H   feeding supplement  1 Container Oral TID BM   mirtazapine   15 mg Oral QHS   multivitamin with minerals  1 tablet Oral Daily   pantoprazole   40 mg Oral BID   polyethylene glycol  17 g Oral BID   Continuous Infusions:  potassium PHOSPHATE  30 mmol in dextrose  5 % 250 mL infusion 30 mmol (12/13/23 0946)      LOS: 4 days   Time spent: 35 minutes  Elvan Sor, MD [facility]   If 7PM-7AM, please contact night-coverage  12/13/2023, 2:17 PM

## 2023-12-14 ENCOUNTER — Encounter: Payer: Self-pay | Admitting: Gastroenterology

## 2023-12-14 DIAGNOSIS — A419 Sepsis, unspecified organism: Secondary | ICD-10-CM | POA: Diagnosis not present

## 2023-12-14 LAB — BASIC METABOLIC PANEL
Anion gap: 12 (ref 5–15)
BUN: 8 mg/dL (ref 8–23)
CO2: 20 mmol/L — ABNORMAL LOW (ref 22–32)
Calcium: 8.3 mg/dL — ABNORMAL LOW (ref 8.9–10.3)
Chloride: 107 mmol/L (ref 98–111)
Creatinine, Ser: 0.53 mg/dL (ref 0.44–1.00)
GFR, Estimated: >60 mL/min (ref >60)
Glucose, Bld: 85 mg/dL (ref 70–99)
Potassium: 3.2 mmol/L — ABNORMAL LOW (ref 3.5–5.1)
Sodium: 139 mmol/L (ref 135–145)

## 2023-12-14 LAB — CBC
HCT: 37.8 % (ref 36.0–46.0)
Hemoglobin: 13.2 g/dL (ref 12.0–15.0)
MCH: 29.9 pg (ref 26.0–34.0)
MCHC: 34.9 g/dL (ref 30.0–36.0)
MCV: 85.7 fL (ref 80.0–100.0)
Platelets: 138 10*3/uL — ABNORMAL LOW (ref 150–400)
RBC: 4.41 MIL/uL (ref 3.87–5.11)
RDW: 14.2 % (ref 11.5–15.5)
WBC: 4.7 10*3/uL (ref 4.0–10.5)
nRBC: 0 % (ref 0.0–0.2)

## 2023-12-14 LAB — MAGNESIUM: Magnesium: 1.7 mg/dL (ref 1.7–2.4)

## 2023-12-14 LAB — PHOSPHORUS: Phosphorus: 2.6 mg/dL (ref 2.5–4.6)

## 2023-12-14 MED ORDER — PANTOPRAZOLE SODIUM 40 MG PO TBEC: 40.0000 mg | DELAYED_RELEASE_TABLET | Freq: Two times a day (BID) | ORAL | Status: AC

## 2023-12-14 MED ORDER — POLYETHYLENE GLYCOL 3350 17 G PO PACK: 17.0000 g | PACK | Freq: Two times a day (BID) | ORAL | 0 refills | Status: AC

## 2023-12-14 MED ORDER — ADULT MULTIVITAMIN W/MINERALS CH: 1.0000 | ORAL_TABLET | Freq: Every day | ORAL | Status: AC

## 2023-12-14 MED ORDER — POTASSIUM CHLORIDE 20 MEQ PO PACK
40.0000 meq | PACK | Freq: Once | ORAL | Status: DC
  Filled 2023-12-14: qty 2

## 2023-12-14 MED ORDER — MIRTAZAPINE 15 MG PO TABS: 15.0000 mg | ORAL_TABLET | Freq: Every day | ORAL | Status: AC

## 2023-12-14 MED ORDER — BISACODYL 5 MG PO TBEC: 10.0000 mg | DELAYED_RELEASE_TABLET | Freq: Every day | ORAL | Status: AC

## 2023-12-14 MED ORDER — POTASSIUM CHLORIDE 20 MEQ PO PACK
40.0000 meq | PACK | ORAL | Status: AC
  Administered 2023-12-14 (×2): 40 meq via ORAL
  Filled 2023-12-14 (×2): qty 2

## 2023-12-14 NOTE — Progress Notes (Signed)
 EMS here to transport the patient. Husband here and is aware of her discharge at this time

## 2023-12-14 NOTE — TOC Transition Note (Addendum)
 Transition of Care [facility]) - Discharge Note   Patient Details  Name: Marilyn Castillo MRN: [id number] Date of Birth: 06-20-1946  Transition of Care [facility]) CM/SW Contact:  Silvano Molt, LCSW Phone Number: 12/14/2023, 1:40 PM   Clinical Narrative:     Patient will DC to: [facility]  Anticipated DC date: 12/14/2023 Family notified: Roger notified on 12/14/2023 Transport by: [facility]  Per MD patient ready for DC to . RN, patient, patient's family, and facility notified of DC. Discharge Summary sent to facility. RN given number for report. DC packet on chart. Ambulance transport requested for patient.  TOC signing off.   Final next level of care: Skilled Nursing Facility Barriers to Discharge: No Barriers Identified   Patient Goals and CMS Choice Patient states their goals for this hospitalization and ongoing recovery are:: to regain independence CMS Medicare.gov Compare Post Acute Care list provided to:: Patient Choice offered to / list presented to : Patient      Discharge Placement              Patient chooses bed at: [facility] [place] Patient to be transferred to facility by: [facility] Name of family member notified: pt and spouse, Sharolyn [phone] Patient and family notified of of transfer: 12/14/23  Discharge Plan and Services Additional resources added to the After Visit Summary for       Post Acute Care Choice:  (TBD)                               Social Drivers of Health (SDOH) Interventions SDOH Screenings   Food Insecurity: No Food Insecurity (12/11/2023)  Housing: Unknown (12/11/2023)  Transportation Needs: No Transportation Needs (12/11/2023)  Utilities: Not At Risk (12/11/2023)  Financial Resource Strain: Low Risk  (10/02/2023)   Received from [facility]  Social Connections: Socially Integrated (12/11/2023)  Tobacco Use: Low Risk  (12/12/2023)     Readmission Risk Interventions     No data to display

## 2023-12-14 NOTE — Discharge Summary (Signed)
 [facility] Discharge Summary   Patient: Marilyn Castillo FMW:[id number]  PCP: Glover Lenis, MD  Date of admission: 12/08/2023   Date of discharge:  12/14/2023     Discharge Diagnoses:  Principal Problem:   Sepsis [facility]) Active Problems:   Episode of unresponsiveness   Hypokalemia   Lactic acidosis   Metabolic acidosis   Fecal impaction (HCC)   Adult failure to thrive   Esophageal dysphagia   Admitted From: Home Disposition:  SNF   Recommendations for Outpatient Follow-up:  Follow with PCP, patient should be seen by an MD in 1 to 2 days.   Follow-up with GI for repeat EGD in 6 weeks Follow up LABS/TEST:  Repeat BMP, mag, and Phos after few days to check electrolytes.   Contact information for after-discharge care     Destination     [facility] Preferred SNF .   Service: Skilled Nursing Contact information: [address] [place]  [address] [phone]                    Diet recommendation: Regular diet  Activity: The patient is advised to gradually reintroduce usual activities, as tolerated  Discharge Condition: stable  Code Status: Full code   History of present illness: As per the H and P dictated on admission Hospital Course:  78 year old female who does not follow regularly with doctors who apparently had difficulty swallowing for the past couple months.  This has been worsening in severity to the point that now she is only drinking water.  Not able to tolerate it does not consume any protein supplements.  Reports weight loss and progressive weakness.  Denies nausea vomiting or diarrhea.  Does endorse constipation.  Denies chest pain.  Was brought to the ER at the urging of her husband.  Apparently patient has not been following up with outpatient physicians and has not been seen by gastroenterology in the past.   While in triage patient suddenly became unresponsive.  No pulse was found.  Blood pressure  apparently 36/24.  Not responsive to noxious stimulus.  Patient did have spontaneous recovery.  Did not require endotracheal intubation no chest compressions.   Etiology of dysphagia is unclear.  Patient is a poor historian.  I will engage consultation from SLP to evaluate for oropharyngeal dysphagia.  If this is unrevealing we will consider GI consultation for endoluminal evaluation     Assessment & Plan:    # Inability to eat, decreased p.o. intake intake # Adult failure to thrive Decreased appetite and inability to eat since October.  She was initially doing Ensure shakes but deteriorated to where she was only consuming water.   Patient was cleared by SLP for regular diet with thin liquids.  No signs or symptoms of oropharyngeal dysphagia.  Continue regular diet as tolerated Continue pantoprazole  twice daily. RD consultation for dietary education 1/6 barium swallow study negative for any significant findings GI consulted disable EGD, normal esophagus, did show some duodenitis and antral ulcer.  Continue PPI and repeat EGD in 6 weeks as an outpatient.   # Hypothermia and Lactic acidosis.  Resolved Brief episode of hypotension and unresponsiveness The episode associated with hypotension and unresponsiveness was noted in triage.  Supposedly systolic blood pressure in the 30s.  This resolved spontaneously without intervention.  Very low suspicion for true cardiac arrest.  Seen in consultation by cardiology.  No plans for ischemic evaluation.  Cardiology signed off. No evidence of sepsis.  Sepsis ruled  out.  DC'd antibiotics Continue fall precautions.  Remained afebrile.   # Hypokalemia, potassium repleted, 2 doses given before discharge # Hypomagnesemia, mag repleted.  Resolved # Hypophosphatemia, Phos repleted.  Resolved Repeat BMP after few days  # Fecal impaction/constipation: Constipation resolved, continue laxatives   Body mass index is 24.65 kg/m.  Nutrition Problem: Inadequate oral  intake Etiology: poor appetite Nutrition Interventions: Interventions: Boost Breeze, Magic cup  Patient was seen by physical therapy, who recommended Therapy, SNF placement, which was arranged. On the day of the discharge the patient's vitals were stable, and no other acute medical condition were reported by patient. the patient was felt safe to be discharge at [facility].  Consultants: GI Procedures: s/p EGD as above  Discharge Exam: General: Appear in no distress, no Rash; Oral Mucosa Clear, moist. Cardiovascular: S1 and S2 Present, no Murmur, Respiratory: normal respiratory effort, Bilateral Air entry present and no Crackles, no wheezes Abdomen: Bowel Sound present, Soft and no tenderness, no hernia Extremities: no Pedal edema, no calf tenderness Neurology: alert and oriented to time, place, and person affect appropriate.  Filed Weights   12/08/23 1748  Weight: 71.4 kg   Vitals:   12/14/23 0327 12/14/23 0808  BP: (!) 153/79 136/76  Pulse: 91 77  Resp: 18 18  Temp: 97.7 F (36.5 C) (!) 97.5 F (36.4 C)  SpO2: 100% 99%    DISCHARGE MEDICATION: Allergies as of 12/14/2023       Reactions   Penicillins Anaphylaxis   Has patient had a PCN reaction causing immediate rash, facial/tongue/throat swelling, SOB or lightheadedness with hypotension: Yes Has patient had a PCN reaction causing severe rash involving mucus membranes or skin necrosis: Unknown Has patient had a PCN reaction that required hospitalization: Unknown Has patient had a PCN reaction occurring within the last 10 years: Unknown If all of the above answers are NO, then may proceed with Cephalosporin use.        Medication List     TAKE these medications    bisacodyl  5 MG EC tablet Commonly known as: DULCOLAX Take 2 tablets (10 mg total) by mouth at bedtime.   mirtazapine  15 MG tablet Commonly known as: REMERON  Take 1 tablet (15 mg total) by mouth at bedtime.   multivitamin with minerals Tabs tablet Take  1 tablet by mouth daily. Start taking on: December 15, 2023   pantoprazole  40 MG tablet Commonly known as: Protonix  Take 1 tablet (40 mg total) by mouth 2 (two) times daily. What changed: when to take this   polyethylene glycol 17 g packet Commonly known as: MIRALAX  / GLYCOLAX  Take 17 g by mouth 2 (two) times daily.       Allergies  Allergen Reactions   Penicillins Anaphylaxis    Has patient had a PCN reaction causing immediate rash, facial/tongue/throat swelling, SOB or lightheadedness with hypotension: Yes Has patient had a PCN reaction causing severe rash involving mucus membranes or skin necrosis: Unknown Has patient had a PCN reaction that required hospitalization: Unknown Has patient had a PCN reaction occurring within the last 10 years: Unknown If all of the above answers are NO, then may proceed with Cephalosporin use.    Discharge Instructions     Call MD for:  difficulty breathing, headache or visual disturbances   Complete by: As directed    Call MD for:  extreme fatigue   Complete by: As directed    Call MD for:  persistant dizziness or light-headedness   Complete by: As directed  Call MD for:  persistant nausea and vomiting   Complete by: As directed    Call MD for:  severe uncontrolled pain   Complete by: As directed    Call MD for:  temperature >100.4   Complete by: As directed    Diet general   Complete by: As directed    Discharge instructions   Complete by: As directed    Follow with PCP, patient should be seen by an MD in 1 to 2 days.  Repeat BMP, mag, and Phos after few days to check electrolytes. Follow-up with GI for repeat EGD in 6 weeks   Increase activity slowly   Complete by: As directed        The results of significant diagnostics from this hospitalization (including imaging, microbiology, ancillary and laboratory) are listed below for reference.    Significant Diagnostic Studies: DG ESOPHAGUS W SINGLE CM (SOL OR THIN BA) Result  Date: 12/12/2023 CLINICAL DATA:  78 year old female. Admitted for hypotension found to be septic. Patient endorses dysphagia. Request is for esophagram for further evaluation of possible stricture EXAM: ESOPHAGUS/BARIUM SWALLOW/TABLET STUDY TECHNIQUE: Single contrast examination was performed using thin liquid barium. This exam was performed by Delon Beagle, and was supervised and interpreted by Dr. Marcey Moan FLUOROSCOPY: Radiation Exposure Index (as provided by the fluoroscopic device): 3.30 mGy Kerma COMPARISON:  None Available. FINDINGS: Swallowing: Appears normal. No vestibular penetration or aspiration seen. Pharynx: Unremarkable. Esophagus: Normal appearance. Esophageal motility: Within normal limits. Hiatal Hernia: None. Gastroesophageal reflux: None visualized. Ingested 13mm barium tablet: Not given Other: None. IMPRESSION: Study was limited due the patient's inability to reposition on exam table or stand. No mass, stricture or significant dysmotility. Electronically Signed   By: Marcey Moan M.D.   On: 12/12/2023 09:37   ECHOCARDIOGRAM COMPLETE Result Date: 12/10/2023    ECHOCARDIOGRAM REPORT   Patient Name:   [facility] Date of Exam: 12/10/2023 Medical Rec #:  [id number]      Height:       67.0 in Accession #:    [id number]     Weight:       157.4 lb Date of Birth:  1946-07-19     BSA:          1.827 m Patient Age:    77 years       BP:           105/42 mmHg Patient Gender: F              HR:           57 bpm. Exam Location:  [facility] Procedure: 2D Echo Indications:     Syncope R55  History:         Patient has no prior history of Echocardiogram examinations.  Sonographer:     Thedora Louder RDCS,FASE Referring Phys:  [id number] DEBBY CROSLEY Diagnosing Phys: Cara JONETTA Lovelace MD IMPRESSIONS  1. Left ventricular ejection fraction, by estimation, is 65 to 70%. The left ventricle has normal function. The left ventricle has no regional wall motion abnormalities. Left ventricular diastolic parameters  are consistent with Grade I diastolic dysfunction (impaired relaxation).  2. Right ventricular systolic function is normal. The right ventricular size is normal.  3. The mitral valve is normal in structure. No evidence of mitral valve regurgitation.  4. The aortic valve is grossly normal. Aortic valve regurgitation is not visualized. FINDINGS  Left Ventricle: Left ventricular ejection fraction, by estimation, is 65 to 70%. The left ventricle has normal function.  The left ventricle has no regional wall motion abnormalities. The left ventricular internal cavity size was normal in size. There is  no left ventricular hypertrophy. Left ventricular diastolic parameters are consistent with Grade I diastolic dysfunction (impaired relaxation). Right Ventricle: The right ventricular size is normal. No increase in right ventricular wall thickness. Right ventricular systolic function is normal. Left Atrium: Left atrial size was normal in size. Right Atrium: Right atrial size was normal in size. Pericardium: There is no evidence of pericardial effusion. Mitral Valve: The mitral valve is normal in structure. No evidence of mitral valve regurgitation. Tricuspid Valve: The tricuspid valve is normal in structure. Tricuspid valve regurgitation is not demonstrated. Aortic Valve: The aortic valve is grossly normal. Aortic valve regurgitation is not visualized. Aortic valve peak gradient measures 9.5 mmHg. Pulmonic Valve: The pulmonic valve was normal in structure. Pulmonic valve regurgitation is not visualized. Aorta: The ascending aorta was not well visualized. IAS/Shunts: No atrial level shunt detected by color flow Doppler.  LEFT VENTRICLE PLAX 2D LVIDd:         4.80 cm   Diastology LVIDs:         3.00 cm   LV e' medial:    7.40 cm/s LV PW:         1.10 cm   LV E/e' medial:  10.7 LV IVS:        1.10 cm   LV e' lateral:   8.70 cm/s LVOT diam:     1.80 cm   LV E/e' lateral: 9.1 LV SV:         76 LV SV Index:   41 LVOT Area:     2.54  cm  RIGHT VENTRICLE RV Basal diam:  2.90 cm RV S prime:     13.40 cm/s LEFT ATRIUM           Index        RIGHT ATRIUM           Index LA diam:      4.00 cm 2.19 cm/m   RA Area:     10.30 cm LA Vol (A2C): 23.1 ml 12.65 ml/m  RA Volume:   21.30 ml  11.66 ml/m LA Vol (A4C): 43.3 ml 23.71 ml/m  AORTIC VALVE                  PULMONIC VALVE AV Area (Vmax): 2.58 cm      RVOT Peak grad: 3 mmHg AV Vmax:        154.00 cm/s AV Peak Grad:   9.5 mmHg LVOT Vmax:      156.00 cm/s LVOT Vmean:     103.000 cm/s LVOT VTI:       0.297 m  AORTA Ao Root diam: 3.10 cm MITRAL VALVE               TRICUSPID VALVE MV Area (PHT): 3.24 cm    TR Peak grad:   20.4 mmHg MV Decel Time: 234 msec    TR Vmax:        226.00 cm/s MV E velocity: 79.10 cm/s MV A velocity: 80.70 cm/s  SHUNTS MV E/A ratio:  0.98        Systemic VTI:  0.30 m                            Systemic Diam: 1.80 cm Cara JONETTA Lovelace MD Electronically signed by Cara JONETTA Lovelace MD Signature Date/Time: 12/10/2023/4:58:25  PM    Final    CT CHEST ABDOMEN PELVIS W CONTRAST Result Date: 12/08/2023 CLINICAL DATA:  Sepsis weakness EXAM: CT CHEST, ABDOMEN, AND PELVIS WITH CONTRAST TECHNIQUE: Multidetector CT imaging of the chest, abdomen and pelvis was performed following the standard protocol during bolus administration of intravenous contrast. RADIATION DOSE REDUCTION: This exam was performed according to the departmental dose-optimization program which includes automated exposure control, adjustment of the mA and/or kV according to patient size and/or use of iterative reconstruction technique. CONTRAST:  OMNIPAQUE  IOHEXOL  300 MG/ML  SOLN COMPARISON:  Chest x-ray 12/08/2023, CT chest 04/08/2017 FINDINGS: CT CHEST FINDINGS Cardiovascular: Mild aortic atherosclerosis. No aneurysm. Normal cardiac size. No pericardial effusion Mediastinum/Nodes: Patent trachea. No thyroid mass. No suspicious lymph nodes. Lungs/Pleura: No acute airspace disease or effusion. Stable scattered  pulmonary nodules, stable, no imaging follow-up is recommended. Musculoskeletal: No acute osseous abnormality CT ABDOMEN PELVIS FINDINGS Hepatobiliary: Cyst in the left hepatic lobe. No calcified gallstone. Dilated common bile duct measuring up to 11 mm. No intra hepatic biliary dilatation. Pancreas: Unremarkable. No pancreatic ductal dilatation or surrounding inflammatory changes. Spleen: Normal in size without focal abnormality. Adrenals/Urinary Tract: Adrenal glands are normal. Kidneys show no hydronephrosis. The bladder is unremarkable Stomach/Bowel: The stomach is nonenlarged. There is no dilated small bowel. No acute bowel wall thickening. Moderate impacted feces at the rectum Vascular/Lymphatic: Moderate aortic atherosclerosis. No aneurysm. No suspicious lymph nodes Reproductive: Uterus demonstrates a large fundal fibroid measuring 10 cm. No adnexal mass. Other: Negative for pelvic effusion or free air. Musculoskeletal: No acute or suspicious osseous abnormality IMPRESSION: 1. No CT evidence for acute intrathoracic abnormality. 2. Dilated common bile duct measuring up to 11 mm. Correlate with LFTs with follow-up MRCP as indicated. 3. Moderate impacted feces at the rectum. 4. Large uterine fundal fibroid measuring 10 cm. 5. Aortic atherosclerosis. Aortic Atherosclerosis (ICD10-I70.0). Electronically Signed   By: Luke Bun M.D.   On: 12/08/2023 21:16   CT Head Wo Contrast Result Date: 12/08/2023 CLINICAL DATA:  Weakness.  Poor intake.  Syncope/presyncope EXAM: CT HEAD WITHOUT CONTRAST TECHNIQUE: Contiguous axial images were obtained from the base of the skull through the vertex without intravenous contrast. RADIATION DOSE REDUCTION: This exam was performed according to the departmental dose-optimization program which includes automated exposure control, adjustment of the mA and/or kV according to patient size and/or use of iterative reconstruction technique. COMPARISON:  CT and MRI head 05/05/2017  FINDINGS: Brain: No intracranial hemorrhage, mass effect, or evidence of acute infarct. No hydrocephalus. No extra-axial fluid collection. Age-commensurate cerebral atrophy and chronic small vessel ischemic disease chronic lacunar infarcts both basal ganglia. Vascular: No hyperdense vessel. Intracranial arterial calcification. Skull: No fracture or focal lesion. Sinuses/Orbits: No acute finding. Other: None. IMPRESSION: No acute intracranial abnormality. Electronically Signed   By: Norman Gatlin M.D.   On: 12/08/2023 19:27   DG Chest Port 1 View Result Date: 12/08/2023 CLINICAL DATA:  Possible sepsis EXAM: PORTABLE CHEST 1 VIEW COMPARISON:  09/21/17 FINDINGS: The heart size and mediastinal contours are within normal limits. Both lungs are clear. The visualized skeletal structures are unremarkable. IMPRESSION: No active disease. Electronically Signed   By: Oneil Devonshire M.D.   On: 12/08/2023 19:11    Microbiology: Recent Results (from the past 240 hours)  Resp panel by RT-PCR (RSV, Flu A&B, Covid) Anterior Nasal Swab     Status: None   Collection Time: 12/08/23  5:17 PM   Specimen: Anterior Nasal Swab  Result Value Ref Range Status  SARS Coronavirus 2 by RT PCR NEGATIVE NEGATIVE Final    Comment: (NOTE) SARS-CoV-2 target nucleic acids are NOT DETECTED.  The SARS-CoV-2 RNA is generally detectable in upper respiratory specimens during the acute phase of infection. The lowest concentration of SARS-CoV-2 viral copies this assay can detect is 138 copies/mL. A negative result does not preclude SARS-Cov-2 infection and should not be used as the sole basis for treatment or other patient management decisions. A negative result may occur with  improper specimen collection/handling, submission of specimen other than nasopharyngeal swab, presence of viral mutation(s) within the areas targeted by this assay, and inadequate number of viral copies(<138 copies/mL). A negative result must be combined  with clinical observations, patient history, and epidemiological information. The expected result is Negative.  Fact Sheet for Patients:  bloggercourse.com  Fact Sheet for Healthcare Providers:  seriousbroker.it  This test is no t yet approved or cleared by the [place]  FDA and  has been authorized for detection and/or diagnosis of SARS-CoV-2 by FDA under an Emergency Use Authorization (EUA). This EUA will remain  in effect (meaning this test can be used) for the duration of the COVID-19 declaration under Section 564(b)(1) of the Act, 21 U.S.C.section 360bbb-3(b)(1), unless the authorization is terminated  or revoked sooner.       Influenza A by PCR NEGATIVE NEGATIVE Final   Influenza B by PCR NEGATIVE NEGATIVE Final    Comment: (NOTE) The Xpert Xpress SARS-CoV-2/FLU/RSV plus assay is intended as an aid in the diagnosis of influenza from Nasopharyngeal swab specimens and should not be used as a sole basis for treatment. Nasal washings and aspirates are unacceptable for Xpert Xpress SARS-CoV-2/FLU/RSV testing.  Fact Sheet for Patients: bloggercourse.com  Fact Sheet for Healthcare Providers: seriousbroker.it  This test is not yet approved or cleared by the [place]  FDA and has been authorized for detection and/or diagnosis of SARS-CoV-2 by FDA under an Emergency Use Authorization (EUA). This EUA will remain in effect (meaning this test can be used) for the duration of the COVID-19 declaration under Section 564(b)(1) of the Act, 21 U.S.C. section 360bbb-3(b)(1), unless the authorization is terminated or revoked.     Resp Syncytial Virus by PCR NEGATIVE NEGATIVE Final    Comment: (NOTE) Fact Sheet for Patients: bloggercourse.com  Fact Sheet for Healthcare Providers: seriousbroker.it  This test is not yet approved  or cleared by the [place]  FDA and has been authorized for detection and/or diagnosis of SARS-CoV-2 by FDA under an Emergency Use Authorization (EUA). This EUA will remain in effect (meaning this test can be used) for the duration of the COVID-19 declaration under Section 564(b)(1) of the Act, 21 U.S.C. section 360bbb-3(b)(1), unless the authorization is terminated or revoked.  Performed at [facility], [address] [place], [place] [address]   Blood Culture (routine x 2)     Status: None   Collection Time: 12/08/23  5:17 PM   Specimen: BLOOD  Result Value Ref Range Status   Specimen Description BLOOD RIGHT ANTECUBITAL  Final   Special Requests   Final    BOTTLES DRAWN AEROBIC AND ANAEROBIC Blood Culture results may not be optimal due to an inadequate volume of blood received in culture bottles   Culture   Final    NO GROWTH 5 DAYS Performed at [facility], [address], [place], [place] [address]    Report Status 12/13/2023 FINAL  Final  Urine Culture     Status: None   Collection Time: 12/09/23  5:17 AM   Specimen: Urine, Random  Result Value Ref Range Status   Specimen Description   Final    URINE, RANDOM Performed at [facility], [address], [place], [place] [address]    Special Requests   Final    NONE Reflexed from [id number] Performed at [facility], [address] [place], [place] [address]    Culture   Final    NO GROWTH Performed at [facility], [address] [place], [place] [address]    Report Status 12/10/2023 FINAL  Final     Labs: CBC: Recent Labs  Lab 12/08/23 1704 12/09/23 9361 12/11/23 0914 12/12/23 0439 12/13/23 0458 12/14/23 0414  WBC 6.1 6.4 5.5 5.2 4.9 4.7  NEUTROABS 3.5  --   --   --   --   --   HGB 17.8* 14.4 11.8* 11.8* 13.6 13.2  HCT 51.8* 41.5 34.2* 32.9* 39.9 37.8  MCV 86.5 85.4 87.0 86.4 86.9 85.7  PLT 245 204 125* 118* 133* 138*   Basic Metabolic Panel: Recent Labs   Lab 12/09/23 0638 12/11/23 0914 12/12/23 0439 12/13/23 0458 12/14/23 0414  NA 137 141 136 140 139  K 3.2* 2.8* 3.5 3.3* 3.2*  CL 98 110 107 106 107  CO2 22 23 20* 22 20*  GLUCOSE 130* 93 92 77 85  BUN 23 13 10 8 8   CREATININE 0.71 0.63 0.52 0.45 0.53  CALCIUM 8.6* 8.1* 7.8* 8.5* 8.3*  MG 2.4 2.0 1.8 1.8 1.7  PHOS  --  1.7* 2.3* 1.6* 2.6   Liver Function Tests: Recent Labs  Lab 12/08/23 1704  AST 19  ALT 13  ALKPHOS 76  BILITOT 2.2*  PROT 7.0  ALBUMIN 3.9   No results for input(s): LIPASE, AMYLASE in the last 168 hours. No results for input(s): AMMONIA in the last 168 hours. Cardiac Enzymes: Recent Labs  Lab 12/08/23 1704  CKTOTAL 17*   BNP (last 3 results) No results for input(s): BNP in the last 8760 hours. CBG: No results for input(s): GLUCAP in the last 168 hours.  Time spent: 35 minutes  Signed:  Elvan Sor  [facility]  12/14/2023 12:20 PM

## 2023-12-14 NOTE — Progress Notes (Signed)
 Husband was trying to order foods that the patient was interested in eating but the soft diet hindered him from being able to order blueberry muffins and sausage. Patient does eat poorly so we are encouraging her to eat. Order received from Dr Von to change diet from soft to regular

## 2023-12-14 NOTE — TOC Progression Note (Addendum)
 Transition of Care [facility]) - Progression Note    Patient Details  Name: Marilyn Castillo MRN: [id number] Date of Birth: [birth year]  [facility] CM/SW Contact  Corean ONEIDA Haddock, RN Phone Number: 12/14/2023, 10:48 AM  Clinical Narrative:     Met with patient and husband at bedside Presented bed offers.  Patient accepts offer at [facility].  Accepted bed in [facility] and notified Tammy at [facility]  Message sent to MD to determine when it is anticipated patient will medically be ready to start insurance auth    Update:  Jon with TOC to start auth  Expected Discharge Plan:  (TBD) Barriers to Discharge: Continued Medical Work up  Expected Discharge Plan and Services     Post Acute Care Choice:  (TBD) Living arrangements for the past 2 months: Single Family Home                                       Social Determinants of Health (SDOH) Interventions SDOH Screenings   Food Insecurity: No Food Insecurity (12/11/2023)  Housing: Unknown (12/11/2023)  Transportation Needs: No Transportation Needs (12/11/2023)  Utilities: Not At Risk (12/11/2023)  Financial Resource Strain: Low Risk  (10/02/2023)   Received from [facility]  Social Connections: Socially Integrated (12/11/2023)  Tobacco Use: Low Risk  (12/12/2023)    Readmission Risk Interventions     No data to display

## 2023-12-14 NOTE — Progress Notes (Signed)
 Mobility Specialist - Progress Note   12/14/23 1441  Mobility  Activity Transferred from bed to chair  Level of Assistance Minimal assist, patient does 75% or more  Assistive Device Front wheel walker  Distance Ambulated (ft) 6 ft  Activity Response Tolerated well  Mobility visit 1 Mobility  Mobility Specialist Start Time (ACUTE ONLY) 1213  Mobility Specialist Stop Time (ACUTE ONLY) 1221  Mobility Specialist Time Calculation (min) (ACUTE ONLY) 8 min   Pt supine upon entry, utilizing RA. Pt agreeable to transfer to the recliner this date. Pt completed bed mob MinA to bring trunk from sup to sit, STS to RW MinA +2 and transferred to the recliner MinA +2 (second person present but not utilized for transfer). Pt left seated in the recliner with alarm set and needs within reach, RN notified.  America Silvan Mobility Specialist 12/14/23 2:56 PM

## 2024-01-24 ENCOUNTER — Telehealth: Payer: Self-pay

## 2024-01-24 NOTE — Telephone Encounter (Signed)
Repeat egd

## 2024-02-07 NOTE — Telephone Encounter (Signed)
 Left message on voicemail.

## 2024-02-09 NOTE — Telephone Encounter (Signed)
 Left message on voicemail.

## 2024-02-26 NOTE — Telephone Encounter (Signed)
 Left message on voicemail.

## 2024-02-29 NOTE — Telephone Encounter (Signed)
 Unable to contact letter mailed.
